# Patient Record
Sex: Male | Born: 1955 | Race: White | Hispanic: No | Marital: Married | Smoking: Former smoker
Health system: Southern US, Community
[De-identification: ages and names within clinical notes are randomized; demographics above are authoritative.]

## PROBLEM LIST (undated history)

## (undated) DIAGNOSIS — R0600 Dyspnea, unspecified: Secondary | ICD-10-CM

## (undated) DIAGNOSIS — J349 Unspecified disorder of nose and nasal sinuses: Secondary | ICD-10-CM

## (undated) DIAGNOSIS — J343 Hypertrophy of nasal turbinates: Secondary | ICD-10-CM

## (undated) DIAGNOSIS — F17200 Nicotine dependence, unspecified, uncomplicated: Secondary | ICD-10-CM

## (undated) DIAGNOSIS — D649 Anemia, unspecified: Secondary | ICD-10-CM

## (undated) DIAGNOSIS — R0602 Shortness of breath: Secondary | ICD-10-CM

## (undated) DIAGNOSIS — F419 Anxiety disorder, unspecified: Secondary | ICD-10-CM

## (undated) DIAGNOSIS — Z86010 Personal history of colonic polyps: Principal | ICD-10-CM

## (undated) HISTORY — PX: WISDOM TOOTH EXTRACTION: SHX21

## (undated) HISTORY — PX: HERNIA REPAIR: SHX51

## (undated) HISTORY — DX: Shortness of breath: R06.02

## (undated) HISTORY — PX: EYE SURGERY: SHX253

## (undated) HISTORY — PX: COLONOSCOPY: SHX174

## (undated) HISTORY — PX: CARPAL TUNNEL RELEASE: SHX101

## (undated) HISTORY — DX: Personal history of colonic polyps: Z86.010

## (undated) HISTORY — PX: TONSILLECTOMY: SUR1361

## (undated) HISTORY — PX: ULNAR NERVE REPAIR: SHX2594

---

## 2001-12-14 ENCOUNTER — Ambulatory Visit (HOSPITAL_BASED_OUTPATIENT_CLINIC_OR_DEPARTMENT_OTHER): Admission: RE | Admit: 2001-12-14 | Discharge: 2001-12-14 | Payer: Self-pay

## 2014-07-27 ENCOUNTER — Other Ambulatory Visit: Payer: Self-pay | Admitting: Cardiology

## 2014-07-27 ENCOUNTER — Ambulatory Visit
Admission: RE | Admit: 2014-07-27 | Discharge: 2014-07-27 | Disposition: A | Discharge status: Home / Self Care | Payer: 59 | Source: Ambulatory Visit | Attending: Cardiology | Admitting: Cardiology

## 2014-07-27 DIAGNOSIS — Z0000 Encounter for general adult medical examination without abnormal findings: Secondary | ICD-10-CM

## 2015-11-02 ENCOUNTER — Encounter: Payer: Self-pay | Admitting: Internal Medicine

## 2015-11-07 ENCOUNTER — Other Ambulatory Visit: Payer: Self-pay | Admitting: Otolaryngology

## 2015-11-07 DIAGNOSIS — R519 Headache, unspecified: Secondary | ICD-10-CM

## 2015-11-07 DIAGNOSIS — R51 Headache: Secondary | ICD-10-CM

## 2015-11-07 DIAGNOSIS — R0981 Nasal congestion: Secondary | ICD-10-CM

## 2015-11-09 ENCOUNTER — Ambulatory Visit
Admission: RE | Admit: 2015-11-09 | Discharge: 2015-11-09 | Disposition: A | Discharge status: Home / Self Care | Payer: BLUE CROSS/BLUE SHIELD | Source: Ambulatory Visit | Attending: Otolaryngology | Admitting: Otolaryngology

## 2015-11-09 DIAGNOSIS — R519 Headache, unspecified: Secondary | ICD-10-CM

## 2015-11-09 DIAGNOSIS — R0981 Nasal congestion: Secondary | ICD-10-CM

## 2015-11-09 DIAGNOSIS — R51 Headache: Secondary | ICD-10-CM

## 2015-11-29 ENCOUNTER — Encounter (HOSPITAL_BASED_OUTPATIENT_CLINIC_OR_DEPARTMENT_OTHER): Payer: Self-pay | Admitting: *Deleted

## 2015-12-01 ENCOUNTER — Ambulatory Visit: Payer: Self-pay | Admitting: Otolaryngology

## 2015-12-01 NOTE — H&P (Signed)
PREOPERATIVE H&P  Chief Complaint: nasal obstrruction  HPI: Austin Glass is a 60 y.o. male who presents for evaluation of a long history of nasal obstruction. On exam he has several large intranasal polys. CT scan showed rather extensive intranasal polyps left side worse than right. He has moderate ethmoid and mild maxillary disease. He has large intranasal turbinates. He's taken to the OR for FESS to remove nasal polyps and reduce the turbinates.  Past Medical History:  Diagnosis Date  . Anxiety   . Hypertrophy of nasal turbinates   . Sinus disease   . Smoker    Past Surgical History:  Procedure Laterality Date  . CARPAL TUNNEL RELEASE Left   . EYE SURGERY     cataract  . HERNIA REPAIR     UHR  . ULNAR NERVE REPAIR Left    decompression   Social History   Social History  . Marital status: Divorced    Spouse name: N/A  . Number of children: N/A  . Years of education: N/A   Social History Main Topics  . Smoking status: Current Every Day Smoker    Packs/day: 1.00  . Smokeless tobacco: Never Used  . Alcohol use Yes     Comment: social  . Drug use: No  . Sexual activity: Not Asked   Other Topics Concern  . None   Social History Narrative  . None   History reviewed. No pertinent family history. Allergies  Allergen Reactions  . Penicillins Rash   Prior to Admission medications   Medication Sig Start Date End Date Taking? Authorizing Provider  fluticasone (FLONASE) 50 MCG/ACT nasal spray Place into both nostrils daily.   Yes Historical Provider, MD  traZODone (DESYREL) 150 MG tablet Take by mouth at bedtime.   Yes Historical Provider, MD     Positive ROS: nasal obstruction  All other systems have been reviewed and were otherwise negative with the exception of those mentioned in the HPI and as above.  Physical Exam: There were no vitals filed for this visit.  General: Alert, no acute distress Oral: Normal oral mucosa and tonsils Nasal: Bilateral  intranasal polyps, Large turbinates. Mild septal deformity Neck: No palpable adenopathy or thyroid nodules Ear: Ear canal is clear with normal appearing TMs Cardiovascular: Regular rate and rhythm, no murmur.  Respiratory: Clear to auscultation Neurologic: Alert and oriented x 3   Assessment/Plan: [place] for Procedure(s): BILATERAL TOTAL  ETHMOIDECTOMY, MAXILLARY OSTIA ENLARGEMENT BILATERAL TURBINATE REDUCTION FUCTIONAL  ENDOSCOPIC SINUS SURGERY WITH FUSION   Melony Overly, MD 12/01/2015 9:47 AM

## 2015-12-05 ENCOUNTER — Ambulatory Visit (HOSPITAL_BASED_OUTPATIENT_CLINIC_OR_DEPARTMENT_OTHER): Payer: BLUE CROSS/BLUE SHIELD | Admitting: Anesthesiology

## 2015-12-05 ENCOUNTER — Ambulatory Visit (HOSPITAL_BASED_OUTPATIENT_CLINIC_OR_DEPARTMENT_OTHER)
Admission: RE | Admit: 2015-12-05 | Discharge: 2015-12-05 | Disposition: A | Discharge status: Home / Self Care | Payer: BLUE CROSS/BLUE SHIELD | Source: Ambulatory Visit | Attending: Otolaryngology | Admitting: Otolaryngology

## 2015-12-05 ENCOUNTER — Encounter (HOSPITAL_BASED_OUTPATIENT_CLINIC_OR_DEPARTMENT_OTHER): Payer: Self-pay | Admitting: *Deleted

## 2015-12-05 ENCOUNTER — Encounter (HOSPITAL_BASED_OUTPATIENT_CLINIC_OR_DEPARTMENT_OTHER)
Admission: RE | Disposition: A | Discharge status: Home / Self Care | Payer: Self-pay | Source: Ambulatory Visit | Attending: Otolaryngology

## 2015-12-05 DIAGNOSIS — F419 Anxiety disorder, unspecified: Secondary | ICD-10-CM | POA: Diagnosis not present

## 2015-12-05 DIAGNOSIS — J3489 Other specified disorders of nose and nasal sinuses: Secondary | ICD-10-CM | POA: Diagnosis not present

## 2015-12-05 DIAGNOSIS — J322 Chronic ethmoidal sinusitis: Secondary | ICD-10-CM | POA: Diagnosis not present

## 2015-12-05 DIAGNOSIS — J320 Chronic maxillary sinusitis: Secondary | ICD-10-CM | POA: Diagnosis not present

## 2015-12-05 DIAGNOSIS — J338 Other polyp of sinus: Secondary | ICD-10-CM | POA: Diagnosis not present

## 2015-12-05 DIAGNOSIS — J343 Hypertrophy of nasal turbinates: Secondary | ICD-10-CM | POA: Insufficient documentation

## 2015-12-05 DIAGNOSIS — F17210 Nicotine dependence, cigarettes, uncomplicated: Secondary | ICD-10-CM | POA: Diagnosis not present

## 2015-12-05 DIAGNOSIS — Z880 Allergy status to penicillin: Secondary | ICD-10-CM | POA: Diagnosis not present

## 2015-12-05 HISTORY — DX: Anxiety disorder, unspecified: F41.9

## 2015-12-05 HISTORY — PX: TURBINATE REDUCTION: SHX6157

## 2015-12-05 HISTORY — PX: SINUS ENDO WITH FUSION: SHX5329

## 2015-12-05 HISTORY — DX: Hypertrophy of nasal turbinates: J34.3

## 2015-12-05 HISTORY — DX: Nicotine dependence, unspecified, uncomplicated: F17.200

## 2015-12-05 HISTORY — DX: Unspecified disorder of nose and nasal sinuses: J34.9

## 2015-12-05 HISTORY — PX: ETHMOIDECTOMY: SHX5197

## 2015-12-05 SURGERY — ETHMOIDECTOMY
Anesthesia: General | Site: Nose | Laterality: Bilateral

## 2015-12-05 MED ORDER — PROPOFOL 500 MG/50ML IV EMUL
INTRAVENOUS | Status: AC
  Filled 2015-12-05: qty 50

## 2015-12-05 MED ORDER — ONDANSETRON HCL 4 MG/2ML IJ SOLN
INTRAMUSCULAR | Status: DC | PRN
  Administered 2015-12-05: 4 mg via INTRAVENOUS

## 2015-12-05 MED ORDER — DEXAMETHASONE SODIUM PHOSPHATE 4 MG/ML IJ SOLN
INTRAMUSCULAR | Status: DC | PRN
  Administered 2015-12-05: 10 mg via INTRAVENOUS

## 2015-12-05 MED ORDER — METHYLPREDNISOLONE ACETATE 80 MG/ML IJ SUSP
INTRAMUSCULAR | Status: DC | PRN
  Administered 2015-12-05: 80 mg

## 2015-12-05 MED ORDER — EPHEDRINE 5 MG/ML INJ
INTRAVENOUS | Status: AC
  Filled 2015-12-05: qty 10

## 2015-12-05 MED ORDER — MUPIROCIN 2 % EX OINT
TOPICAL_OINTMENT | CUTANEOUS | Status: DC | PRN
  Administered 2015-12-05: 1 via TOPICAL

## 2015-12-05 MED ORDER — CEFAZOLIN SODIUM-DEXTROSE 2-4 GM/100ML-% IV SOLN
INTRAVENOUS | Status: AC
  Filled 2015-12-05: qty 100

## 2015-12-05 MED ORDER — SUGAMMADEX SODIUM 200 MG/2ML IV SOLN
INTRAVENOUS | Status: DC | PRN
  Administered 2015-12-05: 200 mg via INTRAVENOUS

## 2015-12-05 MED ORDER — PHENYLEPHRINE 40 MCG/ML (10ML) SYRINGE FOR IV PUSH (FOR BLOOD PRESSURE SUPPORT)
PREFILLED_SYRINGE | INTRAVENOUS | Status: AC
  Filled 2015-12-05: qty 10

## 2015-12-05 MED ORDER — MIDAZOLAM HCL 2 MG/2ML IJ SOLN
1.0000 mg | INTRAMUSCULAR | Status: DC | PRN
  Administered 2015-12-05: 2 mg via INTRAVENOUS

## 2015-12-05 MED ORDER — PROMETHAZINE HCL 25 MG/ML IJ SOLN: 6.2500 mg | INTRAMUSCULAR | Status: DC | PRN

## 2015-12-05 MED ORDER — DEXAMETHASONE SODIUM PHOSPHATE 10 MG/ML IJ SOLN
INTRAMUSCULAR | Status: AC
  Filled 2015-12-05: qty 1

## 2015-12-05 MED ORDER — SUCCINYLCHOLINE CHLORIDE 200 MG/10ML IV SOSY
PREFILLED_SYRINGE | INTRAVENOUS | Status: AC
  Filled 2015-12-05: qty 10

## 2015-12-05 MED ORDER — MUPIROCIN 2 % EX OINT
TOPICAL_OINTMENT | CUTANEOUS | Status: AC
  Filled 2015-12-05: qty 22

## 2015-12-05 MED ORDER — OXYMETAZOLINE HCL 0.05 % NA SOLN
NASAL | Status: DC | PRN
  Administered 2015-12-05: 1 via TOPICAL

## 2015-12-05 MED ORDER — CEFAZOLIN SODIUM-DEXTROSE 2-4 GM/100ML-% IV SOLN
2.0000 g | INTRAVENOUS | Status: AC
  Administered 2015-12-05: 2 g via INTRAVENOUS

## 2015-12-05 MED ORDER — HYDROCODONE-ACETAMINOPHEN 5-325 MG PO TABS: 1.0000 | ORAL_TABLET | Freq: Four times a day (QID) | ORAL | 0 refills | Status: DC | PRN

## 2015-12-05 MED ORDER — ROCURONIUM BROMIDE 100 MG/10ML IV SOLN
INTRAVENOUS | Status: DC | PRN
  Administered 2015-12-05: 50 mg via INTRAVENOUS
  Administered 2015-12-05: 10 mg via INTRAVENOUS

## 2015-12-05 MED ORDER — LIDOCAINE 2% (20 MG/ML) 5 ML SYRINGE
INTRAMUSCULAR | Status: AC
  Filled 2015-12-05: qty 5

## 2015-12-05 MED ORDER — LIDOCAINE-EPINEPHRINE 1 %-1:100000 IJ SOLN
INTRAMUSCULAR | Status: AC
  Filled 2015-12-05: qty 1

## 2015-12-05 MED ORDER — ATROPINE SULFATE 0.4 MG/ML IV SOSY
PREFILLED_SYRINGE | INTRAVENOUS | Status: AC
  Filled 2015-12-05: qty 2.5

## 2015-12-05 MED ORDER — METHYLPREDNISOLONE ACETATE 80 MG/ML IJ SUSP
INTRAMUSCULAR | Status: AC
  Filled 2015-12-05: qty 1

## 2015-12-05 MED ORDER — SCOPOLAMINE 1 MG/3DAYS TD PT72: 1.0000 | MEDICATED_PATCH | Freq: Once | TRANSDERMAL | Status: DC | PRN

## 2015-12-05 MED ORDER — ONDANSETRON HCL 4 MG/2ML IJ SOLN
INTRAMUSCULAR | Status: AC
  Filled 2015-12-05: qty 2

## 2015-12-05 MED ORDER — PROPOFOL 10 MG/ML IV BOLUS
INTRAVENOUS | Status: DC | PRN
Start: 2015-12-05 — End: 2015-12-05
  Administered 2015-12-05: 200 mg via INTRAVENOUS

## 2015-12-05 MED ORDER — FENTANYL CITRATE (PF) 100 MCG/2ML IJ SOLN
INTRAMUSCULAR | Status: AC
  Filled 2015-12-05: qty 2

## 2015-12-05 MED ORDER — LIDOCAINE-EPINEPHRINE 1 %-1:100000 IJ SOLN
INTRAMUSCULAR | Status: DC | PRN
  Administered 2015-12-05: 10 mL

## 2015-12-05 MED ORDER — LACTATED RINGERS IV SOLN
INTRAVENOUS | Status: DC
Start: 2015-12-05 — End: 2015-12-05
  Administered 2015-12-05 (×2): via INTRAVENOUS

## 2015-12-05 MED ORDER — HYDROMORPHONE HCL 1 MG/ML IJ SOLN: 0.2500 mg | INTRAMUSCULAR | Status: DC | PRN

## 2015-12-05 MED ORDER — FENTANYL CITRATE (PF) 100 MCG/2ML IJ SOLN
50.0000 ug | INTRAMUSCULAR | Status: AC | PRN
  Administered 2015-12-05: 50 ug via INTRAVENOUS
  Administered 2015-12-05: 75 ug via INTRAVENOUS
  Administered 2015-12-05: 50 ug via INTRAVENOUS
  Administered 2015-12-05: 25 ug via INTRAVENOUS

## 2015-12-05 MED ORDER — BACITRACIN ZINC 500 UNIT/GM EX OINT
TOPICAL_OINTMENT | CUTANEOUS | Status: AC
Start: 2015-12-05 — End: 2015-12-05
  Filled 2015-12-05: qty 28.35

## 2015-12-05 MED ORDER — CEPHALEXIN 500 MG PO CAPS: 500.0000 mg | ORAL_CAPSULE | Freq: Two times a day (BID) | ORAL | 0 refills | Status: AC

## 2015-12-05 MED ORDER — EPHEDRINE SULFATE 50 MG/ML IJ SOLN
INTRAMUSCULAR | Status: DC | PRN
  Administered 2015-12-05 (×2): 10 mg via INTRAVENOUS

## 2015-12-05 MED ORDER — MIDAZOLAM HCL 2 MG/2ML IJ SOLN
INTRAMUSCULAR | Status: AC
  Filled 2015-12-05: qty 2

## 2015-12-05 MED ORDER — SODIUM CHLORIDE 0.9 % IV SOLN
INTRAVENOUS | Status: DC | PRN
  Administered 2015-12-05: 400 mL via INTRAMUSCULAR

## 2015-12-05 MED ORDER — OXYMETAZOLINE HCL 0.05 % NA SOLN
NASAL | Status: AC
  Filled 2015-12-05: qty 15

## 2015-12-05 SURGICAL SUPPLY — 73 items
ATTRACTOMAT 16X20 MAGNETIC DRP (DRAPES) IMPLANT
BALLN SINUPLASTY KIT 6X16 (BALLOONS)
BALLOON SINUPLASTY KIT 6X16 (BALLOONS) IMPLANT
BLADE INF TURB ROT M4 2 5PK (BLADE) ×3 IMPLANT
BLADE INF TURB ROT M4 2MM 5PK (BLADE) ×1
BLADE RAD60 ROTATE M4 4 5PK (BLADE) IMPLANT
BLADE RAD60 ROTATE M4 4MM 5PK (BLADE)
BLADE ROTATE RAD 12 4 M4 (BLADE) IMPLANT
BLADE ROTATE RAD 12 4MM M4 (BLADE)
BLADE ROTATE RAD 40 4 M4 (BLADE) IMPLANT
BLADE ROTATE RAD 40 4MM M4 (BLADE)
BLADE ROTATE TRICUT 4MX13CM M4 (BLADE) ×1
BLADE ROTATE TRICUT 4X13 M4 (BLADE) ×3 IMPLANT
BUR HS RAD FRONTAL 3 (BURR) IMPLANT
BUR HS RAD FRONTAL 3MM (BURR)
CANISTER SUC SOCK COL 7IN (MISCELLANEOUS) IMPLANT
CANISTER SUCT 1200ML W/VALVE (MISCELLANEOUS) ×8 IMPLANT
CLEANER CAUTERY TIP 5X5 PAD (MISCELLANEOUS) IMPLANT
COAGULATOR SUCT 8FR VV (MISCELLANEOUS) ×4 IMPLANT
COVER PROBE W GEL 5X96 (DRAPES) IMPLANT
DECANTER SPIKE VIAL GLASS SM (MISCELLANEOUS) ×4 IMPLANT
DEVICE INFLATION SEID (MISCELLANEOUS) IMPLANT
DRAPE SURG 17X23 STRL (DRAPES) IMPLANT
DRESSING NASAL KENNEDY 3.5X.9 (MISCELLANEOUS) IMPLANT
DRSG NASAL KENNEDY 3.5X.9 (MISCELLANEOUS)
DRSG NASAL KENNEDY LMNT 8CM (GAUZE/BANDAGES/DRESSINGS) IMPLANT
DRSG NASOPORE 8CM (GAUZE/BANDAGES/DRESSINGS) ×4 IMPLANT
DRSG TELFA 3X8 NADH (GAUZE/BANDAGES/DRESSINGS) IMPLANT
ELECT COATED BLADE 2.86 ST (ELECTRODE) IMPLANT
ELECT NEEDLE BLADE 2-5/6 (NEEDLE) IMPLANT
ELECT REM PT RETURN 9FT ADLT (ELECTROSURGICAL) ×4
ELECTRODE REM PT RTRN 9FT ADLT (ELECTROSURGICAL) ×2 IMPLANT
GLOVE EXAM NITRILE MD LF STRL (GLOVE) ×4 IMPLANT
GLOVE SS BIOGEL STRL SZ 7.5 (GLOVE) ×2 IMPLANT
GLOVE SUPERSENSE BIOGEL SZ 7.5 (GLOVE) ×2
GLOVE SURG SS PI 7.0 STRL IVOR (GLOVE) ×4 IMPLANT
GOWN STRL REUS W/ TWL LRG LVL3 (GOWN DISPOSABLE) ×2 IMPLANT
GOWN STRL REUS W/ TWL XL LVL3 (GOWN DISPOSABLE) ×2 IMPLANT
GOWN STRL REUS W/TWL LRG LVL3 (GOWN DISPOSABLE) ×2
GOWN STRL REUS W/TWL XL LVL3 (GOWN DISPOSABLE) ×2
HEMOSTAT SURGICEL .5X2 ABSORB (HEMOSTASIS) IMPLANT
HEMOSTAT SURGICEL 2X14 (HEMOSTASIS) IMPLANT
IV NS 1000ML (IV SOLUTION)
IV NS 1000ML BAXH (IV SOLUTION) IMPLANT
IV NS 500ML (IV SOLUTION) ×2
IV NS 500ML BAXH (IV SOLUTION) ×2 IMPLANT
NEEDLE PRECISIONGLIDE 27X1.5 (NEEDLE) ×4 IMPLANT
NEEDLE SPNL 25GX3.5 QUINCKE BL (NEEDLE) IMPLANT
NS IRRIG 1000ML POUR BTL (IV SOLUTION) ×4 IMPLANT
PACK BASIN DAY SURGERY FS (CUSTOM PROCEDURE TRAY) ×4 IMPLANT
PACK ENT DAY SURGERY (CUSTOM PROCEDURE TRAY) ×4 IMPLANT
PAD CLEANER CAUTERY TIP 5X5 (MISCELLANEOUS)
PATTIES SURGICAL .5 X3 (DISPOSABLE) ×4 IMPLANT
PENCIL BUTTON HOLSTER BLD 10FT (ELECTRODE) IMPLANT
SHEET SILASTIC 8X6X.030 25-30 (MISCELLANEOUS) IMPLANT
SLEEVE SCD COMPRESS KNEE MED (MISCELLANEOUS) ×4 IMPLANT
SOLUTION ANTI FOG 6CC (MISCELLANEOUS) ×4 IMPLANT
SPONGE GAUZE 2X2 8PLY STER LF (GAUZE/BANDAGES/DRESSINGS) ×2
SPONGE GAUZE 2X2 8PLY STRL LF (GAUZE/BANDAGES/DRESSINGS) ×6 IMPLANT
SUT CHROMIC 4 0 PS 2 18 (SUTURE) IMPLANT
SUT ETHILON 3 0 PS 1 (SUTURE) IMPLANT
SUT SILK 2 0 FS (SUTURE) IMPLANT
SUT VIC AB 4-0 P-3 18XBRD (SUTURE) IMPLANT
SUT VIC AB 4-0 P3 18 (SUTURE)
SYR 3ML 18GX1 1/2 (SYRINGE) IMPLANT
TOWEL OR 17X24 6PK STRL BLUE (TOWEL DISPOSABLE) ×8 IMPLANT
TRACKER ENT INSTRUMENT (MISCELLANEOUS) ×4 IMPLANT
TRACKER ENT PATIENT (MISCELLANEOUS) ×4 IMPLANT
TRAY DSU PREP LF (CUSTOM PROCEDURE TRAY) ×4 IMPLANT
TUBE CONNECTING 20'X1/4 (TUBING) ×1
TUBE CONNECTING 20X1/4 (TUBING) ×3 IMPLANT
TUBING STRAIGHTSHOT EPS 5PK (TUBING) ×4 IMPLANT
YANKAUER SUCT BULB TIP NO VENT (SUCTIONS) ×4 IMPLANT

## 2015-12-05 NOTE — Brief Op Note (Signed)
12/05/2015  9:31 AM  PATIENT:  Austin Glass  60 y.o. male  PRE-OPERATIVE DIAGNOSIS:  SINUS DISEASE, NASAL POLYPS, TURBINATE HYPERTROPHY  POST-OPERATIVE DIAGNOSIS:  SINUS DISEASE, NASAL POLYPS, TURBINATE HYPERTROPHY  PROCEDURE:  Procedure(s): BILATERAL TOTAL  ETHMOIDECTOMY, MAXILLARY OSTEA  ENLARGEMENT (Bilateral) BILATERAL TURBINATE REDUCTION (Bilateral) FUCTIONAL  ENDOSCOPIC SINUS SURGERY WITH FUSION NAVIGATION (Bilateral)  SURGEON:  Surgeon(s) and Role:    * Rozetta Nunnery, MD - Primary  PHYSICIAN ASSISTANT:   ASSISTANTS: none   ANESTHESIA:   general  EBL:  Total I/O In: 1000 [I.V.:1000] Out: 25 [Blood:25]  BLOOD ADMINISTERED:none  DRAINS: none   LOCAL MEDICATIONS USED:  LIDOCAINE with Epi 10 cc  SPECIMEN:  Source of Specimen:  nasal polyps  DISPOSITION OF SPECIMEN:  PATHOLOGY  COUNTS:  YES  TOURNIQUET:  * No tourniquets in log *  DICTATION: .Other Dictation: Dictation Number [id number]  PLAN OF CARE: Discharge to home after PACU  PATIENT DISPOSITION:  PACU - hemodynamically stable.   Delay start of Pharmacological VTE agent (>24hrs) due to surgical blood loss or risk of bleeding: yes

## 2015-12-05 NOTE — Anesthesia Preprocedure Evaluation (Signed)
Anesthesia Evaluation  Patient identified by MRN, date of birth, ID band Patient awake    Reviewed: Allergy & Precautions, NPO status , Patient's Chart, lab work & pertinent test results  Airway Mallampati: II  TM Distance: >3 FB Neck ROM: Full    Dental  (+) Teeth Intact   Pulmonary COPD, Current Smoker,   Smokers cough  + rhonchi        Cardiovascular negative cardio ROS   Rhythm:Regular Rate:Normal     Neuro/Psych negative neurological ROS     GI/Hepatic negative GI ROS, Neg liver ROS,   Endo/Other  negative endocrine ROS  Renal/GU negative Renal ROS     Musculoskeletal   Abdominal   Peds  Hematology negative hematology ROS (+)   Anesthesia Other Findings   Reproductive/Obstetrics                             Anesthesia Physical Anesthesia Plan  ASA: II  Anesthesia Plan: General   Post-op Pain Management:    Induction: Intravenous  Airway Management Planned: Oral ETT  Additional Equipment:   Intra-op Plan:   Post-operative Plan: Extubation in OR  Informed Consent: I have reviewed the patients History and Physical, chart, labs and discussed the procedure including the risks, benefits and alternatives for the proposed anesthesia with the patient or authorized representative who has indicated his/her understanding and acceptance.     Plan Discussed with: CRNA  Anesthesia Plan Comments:         Anesthesia Quick Evaluation

## 2015-12-05 NOTE — Anesthesia Procedure Notes (Signed)
Procedure Name: Intubation Date/Time: 12/05/2015 7:40 AM Performed by: Melynda Ripple D Pre-anesthesia Checklist: Patient identified, Emergency Drugs available, Suction available and Patient being monitored Patient Re-evaluated:Patient Re-evaluated prior to inductionOxygen Delivery Method: Circle system utilized Preoxygenation: Pre-oxygenation with 100% oxygen Intubation Type: IV induction Ventilation: Mask ventilation without difficulty Laryngoscope Size: Mac and 3 Grade View: Grade I Tube type: Oral Tube size: 7.0 mm Number of attempts: 1 Airway Equipment and Method: Stylet and Oral airway Placement Confirmation: ETT inserted through vocal cords under direct vision,  positive ETCO2 and breath sounds checked- equal and bilateral Secured at: 24 cm Tube secured with: Tape Dental Injury: Teeth and Oropharynx as per pre-operative assessment

## 2015-12-05 NOTE — Interval H&P Note (Signed)
History and Physical Interval Note:  12/05/2015 7:27 AM  Austin Glass  has presented today for surgery, with the diagnosis of SINUS DISEASE,TURBINATE HYPERTROPHY  The various methods of treatment have been discussed with the patient and family. After consideration of risks, benefits and other options for treatment, the patient has consented to  Procedure(s): BILATERAL TOTAL  ETHMOIDECTOMY, MAXILLARY OSTIA ENLARGEMENT (Bilateral) BILATERAL TURBINATE REDUCTION (Bilateral) FUCTIONAL  ENDOSCOPIC SINUS SURGERY WITH FUSION (N/A) as a surgical intervention .  The patient's history has been reviewed, patient examined, no change in status, stable for surgery.  I have reviewed the patient's chart and labs.  Questions were answered to the patient's satisfaction.     Taija Mathias

## 2015-12-05 NOTE — Progress Notes (Signed)
Pt on admission to PACU, talking. He has asked several times if he was alive. Explained yes and then he said he would be better off not being here. Pt constant mumbling.

## 2015-12-05 NOTE — Transfer of Care (Signed)
Immediate Anesthesia Transfer of Care Note  Patient: Austin Glass  Procedure(s) Performed: Procedure(s): BILATERAL TOTAL  ETHMOIDECTOMY, MAXILLARY OSTEA  ENLARGEMENT (Bilateral) BILATERAL TURBINATE REDUCTION (Bilateral) FUCTIONAL  ENDOSCOPIC SINUS SURGERY WITH FUSION NAVIGATION (Bilateral)  Patient Location: PACU  Anesthesia Type:General  Level of Consciousness: awake  Airway & Oxygen Therapy: Patient Spontanous Breathing and Patient connected to face mask oxygen  Post-op Assessment: Report given to RN and Post -op Vital signs reviewed and stable  Post vital signs: Reviewed and stable  Last Vitals:  Vitals:   12/05/15 0618  BP: 126/77  Pulse: 67  Resp: 18  Temp: 36.6 C    Last Pain:  Vitals:   12/05/15 0618  TempSrc: Oral         Complications: No apparent anesthesia complications

## 2015-12-05 NOTE — Anesthesia Postprocedure Evaluation (Signed)
Anesthesia Post Note  Patient: Austin Glass  Procedure(s) Performed: Procedure(s) (LRB): BILATERAL TOTAL  ETHMOIDECTOMY, MAXILLARY OSTEA  ENLARGEMENT (Bilateral) BILATERAL TURBINATE REDUCTION (Bilateral) FUCTIONAL  ENDOSCOPIC SINUS SURGERY WITH FUSION NAVIGATION (Bilateral)  Patient location during evaluation: PACU Anesthesia Type: General Level of consciousness: awake and alert Pain management: pain level controlled Vital Signs Assessment: post-procedure vital signs reviewed and stable Respiratory status: spontaneous breathing, nonlabored ventilation, respiratory function stable and patient connected to nasal cannula oxygen Cardiovascular status: blood pressure returned to baseline and stable Postop Assessment: no signs of nausea or vomiting Anesthetic complications: no    Last Vitals:  Vitals:   12/05/15 1030 12/05/15 1059  BP: 130/81 (!) 150/76  Pulse: 81 82  Resp: 14 18  Temp:  36.6 C    Last Pain:  Vitals:   12/05/15 1059  TempSrc: Oral  PainSc: 0-No pain                 Eunice Winecoff,Darrick TERRILL

## 2015-12-05 NOTE — Discharge Instructions (Addendum)
Tylenol , motrin or hydrocodone prn pain Apply cool compress to nose if you have much bleeding  Irrigate nose with saline nasal spray 2-3 times daily Return to see Dr Lucia Gaskins at his office in 3 days   Friday at 4:15 Call office if you have any problems or questions    [phone]       Post Anesthesia Home Care Instructions  Activity: Get plenty of rest for the remainder of the day. A responsible adult should stay with you for 24 hours following the procedure.  For the next 24 hours, DO NOT: -Drive a car -Paediatric nurse -Drink alcoholic beverages -Take any medication unless instructed by your physician -Make any legal decisions or sign important papers.  Meals: Start with liquid foods such as gelatin or soup. Progress to regular foods as tolerated. Avoid greasy, spicy, heavy foods. If nausea and/or vomiting occur, drink only clear liquids until the nausea and/or vomiting subsides. Call your physician if vomiting continues.  Special Instructions/Symptoms: Your throat may feel dry or sore from the anesthesia or the breathing tube placed in your throat during surgery. If this causes discomfort, gargle with warm salt water. The discomfort should disappear within 24 hours.  If you had a scopolamine patch placed behind your ear for the management of post- operative nausea and/or vomiting:  1. The medication in the patch is effective for 72 hours, after which it should be removed.  Wrap patch in a tissue and discard in the trash. Wash hands thoroughly with soap and water. 2. You may remove the patch earlier than 72 hours if you experience unpleasant side effects which may include dry mouth, dizziness or visual disturbances. 3. Avoid touching the patch. Wash your hands with soap and water after contact with the patch.

## 2015-12-06 ENCOUNTER — Encounter (HOSPITAL_BASED_OUTPATIENT_CLINIC_OR_DEPARTMENT_OTHER): Payer: Self-pay | Admitting: Otolaryngology

## 2015-12-06 NOTE — Op Note (Signed)
NAME:  AYCE, BOMBARA                     ACCOUNT NO.:  MEDICAL RECORD NO.:  [id number]  LOCATION:                                 FACILITY:  PHYSICIAN:  [name]. Lucia Gaskins, M.D. DATE OF BIRTH:  DATE OF PROCEDURE:  12/05/2015 DATE OF DISCHARGE:                              OPERATIVE REPORT   PREOPERATIVE DIAGNOSES:  Bilateral sinonasal polyps with chronic ethmoid, maxillary disease and bilateral inferior turbinate hypertrophy with nasal obstruction.  POSTOPERATIVE DIAGNOSES:  Bilateral sinonasal polyps with chronic ethmoid, maxillary disease and bilateral inferior turbinate hypertrophy with nasal obstruction.  OPERATION PERFORMED:  Functional endoscopic sinus surgery with bilateral total ethmoidectomy and bilateral maxillary ostial enlargement with removal of sinonasal polyps.  Bilateral inferior turbinate reductions with Medtronic turbinate blade.  Use of Fusion.  SURGEON:  [name]. Lucia Gaskins, M.D.  ANESTHESIA:  General endotracheal.  COMPLICATIONS:  None.  BRIEF CLINICAL NOTE:  Austin Glass is a 60 year old gentleman who has had longstanding nasal obstruction and on exam, has bilateral sinonasal polyps.  He had couple of larger polyps removed from the right nasal passageway earlier in the office and had improved breathing, but still has significant polyps in the left nasal airway as well as a CT scan demonstrated bilateral ethmoid, nasal frontal and some maxillary disease on both sides.  He still has nasal obstruction, although, he is breathing better since removing the polyps in the nose in the office. He was taken to the operating room at this time for functional endoscopic sinus surgery with removal of sinonasal polyps and turbinate reductions.  DESCRIPTION OF PROCEDURE:  After adequate endotracheal anesthesia, the patient received 2 g of Ancef and 10 mg of Decadron IV preoperatively. Nose was prepped with Betadine solution.  The Fusion apparatus  was calibrated.  First, several large polyps were removed from the left nasal cavity and sent to Pathology.  Next, the right side was approached.  Uncinate process was incised with sickle knife and removed the maxillary ostia on the right side was identified and was enlarged with straight through cup and backbiting forceps.  Next, using the microdebrider, the anterior and posterior ethmoid cells were opened up and polypoid disease was removed.  This was extended up to the nasal frontal area.  Following this, the left eye was approached.  Again, the uncinate process was removed with a sickle knife and the maxillary ostia on left side were identified and enlarged with straight through cup and backcutting forceps.  Using the microdebrider, again the anterior and posterior ethmoid cells were opened up anteriorly and posteriorly and polypoid disease was removed.  The Fusion apparatus was used to identify the roof of the ethmoid and the lamina.  On the left side, nasal frontal polyps were removed with a microdebrider.  There was a polyp extruding from the posterior medial aspect of the left middle turbinate, which was removed.  After opening up the anterior and posterior ethmoid cells and removing all the visual polyps, the procedure was completed.  Hemostasis was obtained with suction cautery.  The nasal cavity and turbinates were injected with 80 mg of Depo-Medrol and NasoPore soaked in Bactroban 2% ointment was placed within  the middle meatus and ethmoid areas bilaterally.  Medtronic turbinate blade was used to reduce the inferior turbinates and the turbinates were outfractured.  Hemostasis was obtained with cautery.  This completed the procedure.  Chrostopher was awoken from anesthesia and transferred to the recovery room, postop doing well.  DISPOSITION:  He was discharged home later this morning on Keflex 500 mg b.i.d. for 1 week, Tylenol, Motrin and/or hydrocodone p.r.n. pain.  I will have  him follow up in my office in 3 days for recheck.          ______________________________ [name]. Lucia Gaskins, M.D.     CEN/MEDQ  D:  12/05/2015  T:  12/06/2015  Job:  [id number]  cc:   Claris Gower, M.D.

## 2015-12-06 NOTE — Op Note (Unsigned)
Austin Glass, Austin Glass                ACCOUNT NO.:  [id number]  MEDICAL RECORD NO.:  [id number]  LOCATION:                                 FACILITY:  PHYSICIAN:  [place] Austin Glass, M.D.DATE OF BIRTH:  01-24-1956  DATE OF PROCEDURE:  12/05/2015 DATE OF DISCHARGE:                              OPERATIVE REPORT   PREOPERATIVE DIAGNOSES:  Bilateral sinonasal polyps with turbinate hypertrophy and ethmoid, maxillary and frontal disease.  POSTOPERATIVE DIAGNOSES:  Bilateral sinonasal polyps with turbinate hypertrophy and ethmoid, maxillary and frontal disease.  OPERATION PERFORMED:  Functional endoscopic sinus surgery with bilateral total ethmoidectomy and bilateral maxillary ostial enlargement with removal of sinonasal polyps.  Bilateral inferior turbinate reductions with Medtronic turbinate blade.  SURGEON:  Austin Glass. Austin Glass, M.D.  ANESTHESIA:  General endotracheal.  COMPLICATIONS:  None.  BRIEF CLINICAL NOTE:  Austin Glass is a 60 year old gentleman who has had chronic history of nasal obstruction and on exam, has bilateral sinonasal polyps.  He had the larger polyps removed from the right nasal cavity in the office, but still has nasal obstruction, worse on the left side.  Recent CT scan shows bilateral ethmoid minimal maxillary disease and partial obstruction of the left nasal frontal area.  On exam, he has minimal septal deformity.  He has large turbinates and swollen mucous membranes and several polyps.  He was taken to the operating room at this time for functional endoscopic sinus surgery with removal of sinonasal polyps and bilateral inferior turbinate reductions.  DESCRIPTION OF PROCEDURE:  After adequate endotracheal anesthesia, the patient received 10 mg of Decadron and 2 g of Ancef IV preoperatively. Nose was prepped with Betadine solution and draped out with sterile towels.  The Fusion apparatus was used during the procedure and was calibrated.  On exam, he  has several large polyps in the left nasal passageway, which were removed with forceps and sent to Pathology. Next, the right side was approached.  The uncinate process was incised with sickle knife.  Maxillary ostia were enlarged with straight through cup forceps and backbiting cup forceps.  The microdebrider was used to remove the several polyps from the right anterior and posterior ethmoid area as well as the right nasal frontal region.  This completed the right side.  Next, the left side was approached.  Again, uncinate process was incised and removed.  The maxillary ostia on the left side were identified and enlarged with straight through cup and backcutting forceps.  Using the microdebrider, polyps were removed from the ethmoid area, and anterior and posterior ethmoid cells were opened up.  In addition, on the left side, he had a polyp posteriorly on the medial aspect of the middle turbinate, which was removed.  Microdebrider was used to open up the nasal frontal area on the left side.  Fusion was then used identify the roof of the ethmoid area.  The lamina as well as the roof were preserved throughout the procedure using a Fusion.  This completed the left side.  Next, using the Medtronic turbinate blade, submucosal turbinate reductions were performed bilaterally and the turbinates were outfractured.  Hemostasis was obtained with suction cautery.  After obtaining adequate hemostasis, the nasal  cavity was injected with 80 mg of Depo-Medrol.  NasoPore soaked in Bactroban 2% was placed in the middle meatus and the ethmoid areas on both sides.  This completed the procedure.  Austin Glass was awoken from anesthesia and transferred to the recovery room, postop doing well.  DISPOSITION:  Austin Glass was discharged home later this morning on Keflex 500 mg b.i.d. for [redacted] week along with saline irrigations, Tylenol, Motrin and/or hydrocodone p.r.n. pain.  We will have him follow up in my office in 3 days  for recheck.    ______________________________ Austin Glass. Austin Glass, M.D.   ______________________________ Austin Glass. Austin Glass, M.D.    CEN/MEDQ  D:  12/05/2015  T:  12/06/2015  Job:  [id number]  cc:   Austin Glass, M.D.

## 2015-12-14 ENCOUNTER — Ambulatory Visit (AMBULATORY_SURGERY_CENTER): Payer: BLUE CROSS/BLUE SHIELD

## 2015-12-14 VITALS — Ht 67.0 in | Wt 189.8 lb

## 2015-12-14 DIAGNOSIS — Z1211 Encounter for screening for malignant neoplasm of colon: Secondary | ICD-10-CM

## 2015-12-14 MED ORDER — BISACODYL 5 MG PO TBEC: 5.0000 mg | DELAYED_RELEASE_TABLET | Freq: Once | ORAL | 0 refills | Status: AC

## 2015-12-14 MED ORDER — POLYETHYLENE GLYCOL 3350 17 GM/SCOOP PO POWD: 1.0000 | Freq: Every day | ORAL | 3 refills | Status: DC

## 2015-12-14 NOTE — Progress Notes (Signed)
No allergies to eggs or soy No past problems with anesthesia No diet meds No home oxygen  Registered for emmi 

## 2015-12-15 ENCOUNTER — Encounter: Payer: Self-pay | Admitting: Internal Medicine

## 2015-12-28 ENCOUNTER — Ambulatory Visit (AMBULATORY_SURGERY_CENTER): Payer: BLUE CROSS/BLUE SHIELD | Admitting: Internal Medicine

## 2015-12-28 ENCOUNTER — Encounter: Payer: Self-pay | Admitting: Internal Medicine

## 2015-12-28 VITALS — BP 106/75 | HR 92 | Temp 98.4°F | Resp 18 | Ht 67.0 in | Wt 198.0 lb

## 2015-12-28 DIAGNOSIS — D123 Benign neoplasm of transverse colon: Secondary | ICD-10-CM

## 2015-12-28 DIAGNOSIS — Z1211 Encounter for screening for malignant neoplasm of colon: Secondary | ICD-10-CM | POA: Diagnosis not present

## 2015-12-28 DIAGNOSIS — D122 Benign neoplasm of ascending colon: Secondary | ICD-10-CM | POA: Diagnosis not present

## 2015-12-28 DIAGNOSIS — Z1212 Encounter for screening for malignant neoplasm of rectum: Secondary | ICD-10-CM

## 2015-12-28 MED ORDER — SODIUM CHLORIDE 0.9 % IV SOLN: 500.0000 mL | INTRAVENOUS | Status: DC

## 2015-12-28 NOTE — Op Note (Signed)
[facility] Patient Name: Austin Glass Procedure Date: 12/28/2015 10:46 AM MRN: [id number] Endoscopist: Gatha Mayer , MD Age: 60 Referring MD:  Date of Birth: [birth year] Gender: Male Account #: [id number] Procedure:                Colonoscopy Indications:              Screening for colorectal malignant neoplasm, This                            is the patient's first colonoscopy Medicines:                Propofol per Anesthesia, Monitored Anesthesia Care Procedure:                Pre-Anesthesia Assessment:                           - Prior to the procedure, a History and Physical                            was performed, and patient medications and                            allergies were reviewed. The patient's tolerance of                            previous anesthesia was also reviewed. The risks                            and benefits of the procedure and the sedation                            options and risks were discussed with the patient.                            All questions were answered, and informed consent                            was obtained. Prior Anticoagulants: The patient has                            taken no previous anticoagulant or antiplatelet                            agents. ASA Grade Assessment: II - A patient with                            mild systemic disease. After reviewing the risks                            and benefits, the patient was deemed in                            satisfactory condition to undergo the procedure.  After obtaining informed consent, the colonoscope                            was passed under direct vision. Throughout the                            procedure, the patient's blood pressure, pulse, and                            oxygen saturations were monitored continuously. The                            Model CF-HQ190L [id number]) scope was introduced   through the anus and advanced to the the cecum,                            identified by appendiceal orifice and ileocecal                            valve. The colonoscopy was performed without                            difficulty. The patient tolerated the procedure                            well. The quality of the bowel preparation was                            good. The bowel preparation used was Miralax. The                            ileocecal valve, appendiceal orifice, and rectum                            were photographed. Scope In: 11:03:54 AM Scope Out: 11:26:06 AM Scope Withdrawal Time: 0 hours 20 minutes 22 seconds  Total Procedure Duration: 0 hours 22 minutes 12 seconds  Findings:                 The perianal and digital rectal examinations were                            normal. Pertinent negatives include normal prostate                            (size, shape, and consistency).                           Four sessile polyps were found in the transverse                            colon and ascending colon. The polyps were 2 to 5                            mm in size. These  polyps were removed with a cold                            snare. Resection and retrieval were complete.                            Verification of patient identification for the                            specimen was done. Estimated blood loss was minimal.                           Multiple large-mouthed diverticula were found in                            the left colon.                           The exam was otherwise without abnormality on                            direct and retroflexion views. Complications:            No immediate complications. Estimated Blood Loss:     Estimated blood loss was minimal. Impression:               - Four 2 to 5 mm polyps in the transverse colon and                            in the ascending colon, removed with a cold snare.                             Resected and retrieved.                           - Diverticulosis in the left colon.                           - The examination was otherwise normal on direct                            and retroflexion views. Recommendation:           - Patient has a contact number available for                            emergencies. The signs and symptoms of potential                            delayed complications were discussed with the                            patient. Return to normal activities tomorrow.                            Written discharge instructions were  provided to the                            patient.                           - Resume previous diet.                           - Continue present medications.                           - Repeat colonoscopy is recommended. The                            colonoscopy date will be determined after pathology                            results from today's exam become available for                            review. Gatha Mayer, MD 12/28/2015 11:36:46 AM This report has been signed electronically.

## 2015-12-28 NOTE — Progress Notes (Signed)
To PACU, vss patent aw report to rn 

## 2015-12-28 NOTE — Patient Instructions (Addendum)
I found and removed 4 small polyps today. You also have a condition called diverticulosis - common and not usually a problem. Please read the handout provided.  I will let you know pathology results and when to have another routine colonoscopy by mail.  I appreciate the opportunity to care for you. Gatha Mayer, MD, FACG    YOU HAD AN ENDOSCOPIC PROCEDURE TODAY AT [facility]:   Refer to the procedure report that was given to you for any specific questions about what was found during the examination.  If the procedure report does not answer your questions, please call your gastroenterologist to clarify.  If you requested that your care partner not be given the details of your procedure findings, then the procedure report has been included in a sealed envelope for you to review at your convenience later.  YOU SHOULD EXPECT: Some feelings of bloating in the abdomen. Passage of more gas than usual.  Walking can help get rid of the air that was put into your GI tract during the procedure and reduce the bloating. If you had a lower endoscopy (such as a colonoscopy or flexible sigmoidoscopy) you may notice spotting of blood in your stool or on the toilet paper. If you underwent a bowel prep for your procedure, you may not have a normal bowel movement for a few days.  Please Note:  You might notice some irritation and congestion in your nose or some drainage.  This is from the oxygen used during your procedure.  There is no need for concern and it should clear up in a day or so.  SYMPTOMS TO REPORT IMMEDIATELY:   Following lower endoscopy (colonoscopy or flexible sigmoidoscopy):  Excessive amounts of blood in the stool  Significant tenderness or worsening of abdominal pains  Swelling of the abdomen that is new, acute  Fever of 100F or higher    For urgent or emergent issues, a gastroenterologist can be reached at any hour by calling [phone].   DIET:  We do  recommend a small meal at first, but then you may proceed to your regular diet.  Drink plenty of fluids but you should avoid alcoholic beverages for 24 hours.  ACTIVITY:  You should plan to take it easy for the rest of today and you should NOT DRIVE or use heavy machinery until tomorrow (because of the sedation medicines used during the test).    FOLLOW UP: Our staff will call the number listed on your records the next business day following your procedure to check on you and address any questions or concerns that you may have regarding the information given to you following your procedure. If we do not reach you, we will leave a message.  However, if you are feeling well and you are not experiencing any problems, there is no need to return our call.  We will assume that you have returned to your regular daily activities without incident.  If any biopsies were taken you will be contacted by phone or by letter within the next 1-3 weeks.  Please call us at [phone] if you have not heard about the biopsies in 3 weeks.    SIGNATURES/CONFIDENTIALITY: You and/or your care partner have signed paperwork which will be entered into your electronic medical record.  These signatures attest to the fact that that the information above on your After Visit Summary has been reviewed and is understood.  Full responsibility of the confidentiality of this discharge  information lies with you and/or your care-partner.   Information on polyps and diverticulosis given to you today

## 2015-12-28 NOTE — Progress Notes (Signed)
Called to room to assist during endoscopic procedure.  Patient ID and intended procedure confirmed with present staff. Received instructions for my participation in the procedure from the performing physician.  

## 2015-12-29 ENCOUNTER — Telehealth: Payer: Self-pay

## 2015-12-29 NOTE — Telephone Encounter (Signed)
  Follow up Call-  Call back number 12/28/2015  Post procedure Call Back phone  # [phone]  Permission to leave phone message Yes  Some recent data might be hidden     Patient questions:  Do you have a fever, pain , or abdominal swelling? No. Pain Score  0 *  Have you tolerated food without any problems? Yes.    Have you been able to return to your normal activities? Yes.    Do you have any questions about your discharge instructions: Diet   No. Medications  No. Follow up visit  No.  Do you have questions or concerns about your Care? No.  Actions: * If pain score is 4 or above: No action needed, pain <4.

## 2016-01-04 ENCOUNTER — Encounter: Payer: Self-pay | Admitting: Internal Medicine

## 2016-01-04 DIAGNOSIS — Z86010 Personal history of colonic polyps: Secondary | ICD-10-CM

## 2016-01-04 DIAGNOSIS — Z860101 Personal history of adenomatous and serrated colon polyps: Secondary | ICD-10-CM

## 2016-01-04 HISTORY — DX: Personal history of colonic polyps: Z86.010

## 2016-01-04 HISTORY — DX: Personal history of adenomatous and serrated colon polyps: Z86.0101

## 2016-01-04 NOTE — Progress Notes (Signed)
4 small adenomas recall 2020

## 2017-06-28 IMAGING — CT CT MAXILLOFACIAL W/O CM
3 series · 16 of 40 positions shown, 19 images · non-contrast
Comparison: None.

CLINICAL DATA: Nasal polyps.  Headache.

EXAM:
CT MAXILLOFACIAL WITHOUT CONTRAST
TECHNIQUE: Multidetector CT imaging of the maxillofacial structures was
performed. Multiplanar CT image reconstructions were also generated.
A small metallic BB was placed on the right temple in order to
reliably differentiate right from left.

[Series 3: axial soft 1.25 · axial · 0.53mm/px · z∈[-62,+12]mm · 4 of 181 slices shown]
[im 21/181  brain]
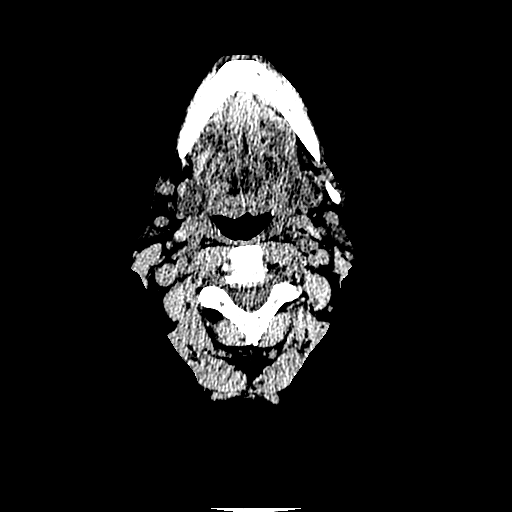
[im 41/181  brain]
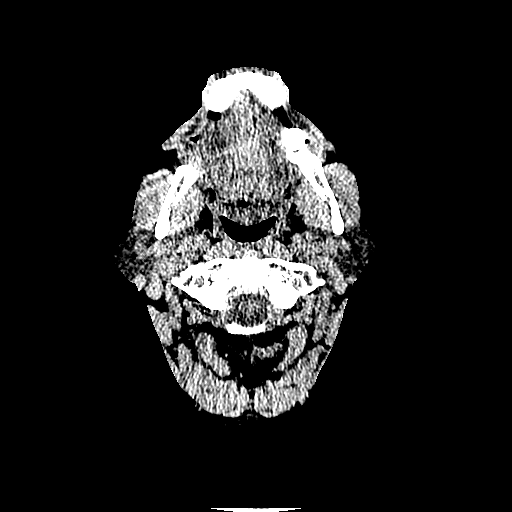
[im 61/181  brain]
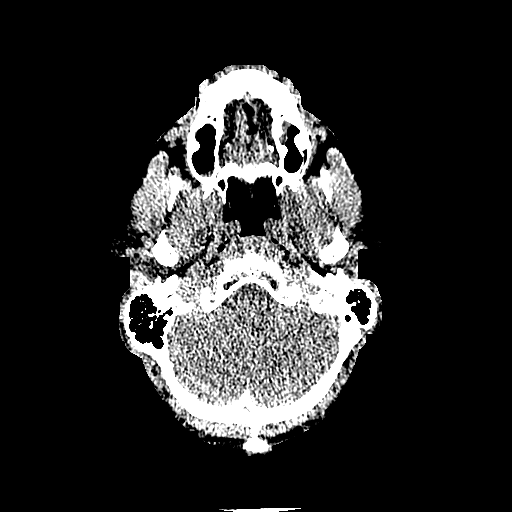
[im 81/181  brain]
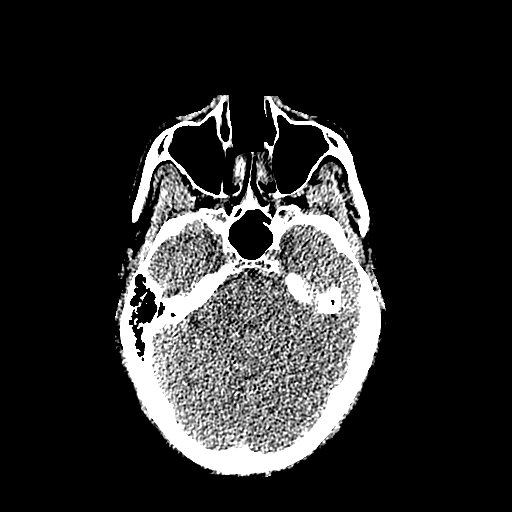

[Series 601: coronal facial · coronal · 0.53mm/px · 3 of 126 slices shown]
[im 42/126  bone]
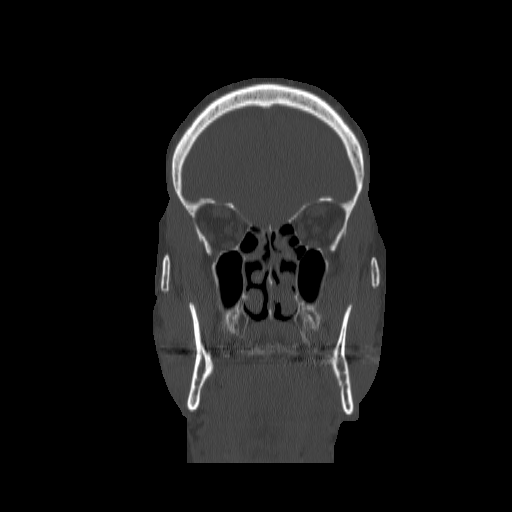
[im 56/126  bone]
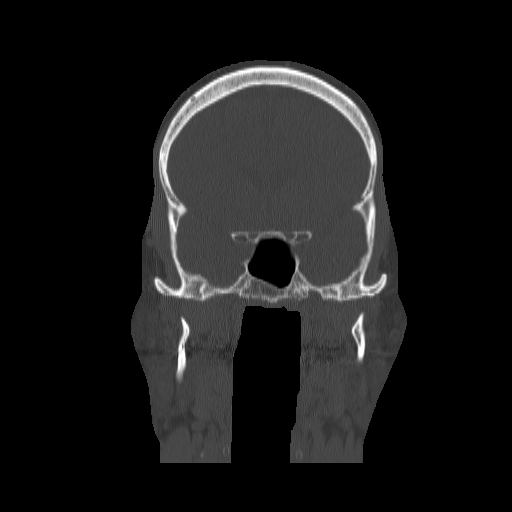
[im 70/126  bone]
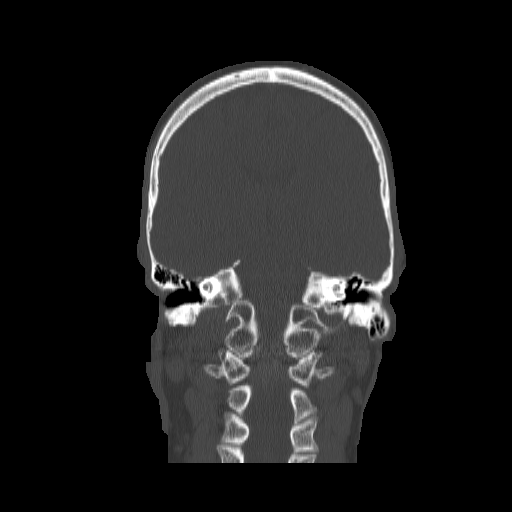

[Series 602: sagittal facial · sagittal · 0.53mm/px · 9 of 108 slices shown, 12 images]
[im 11/108  brain]
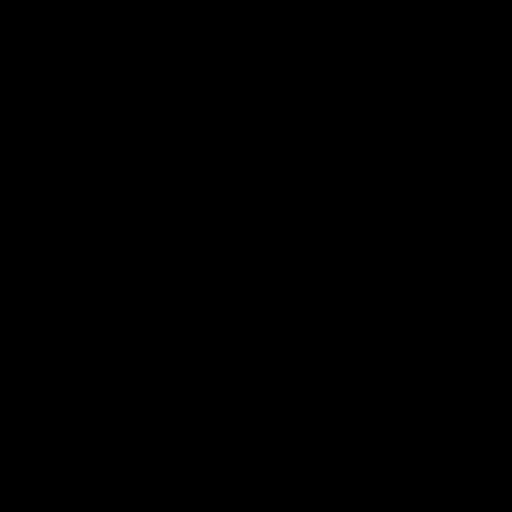
[im 11/108  bone]
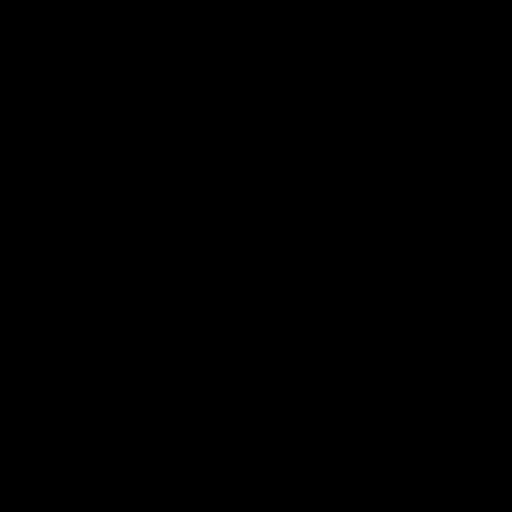
[im 22/108  bone]
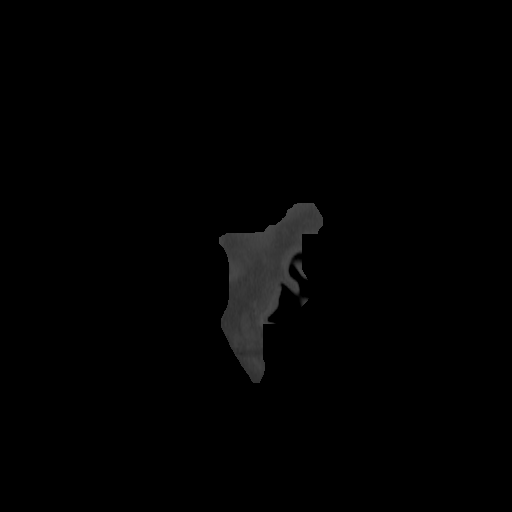
[im 33/108  bone]
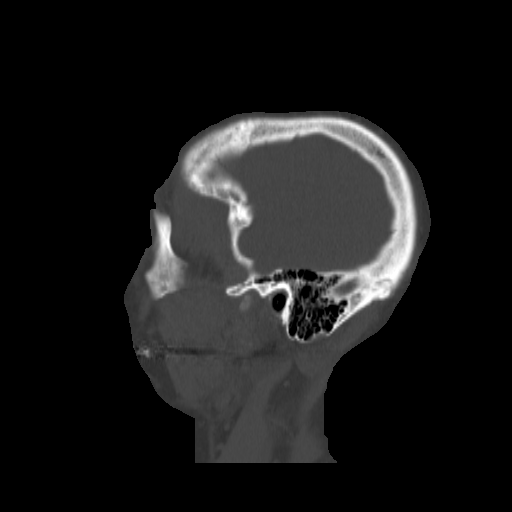
[im 43/108  bone]
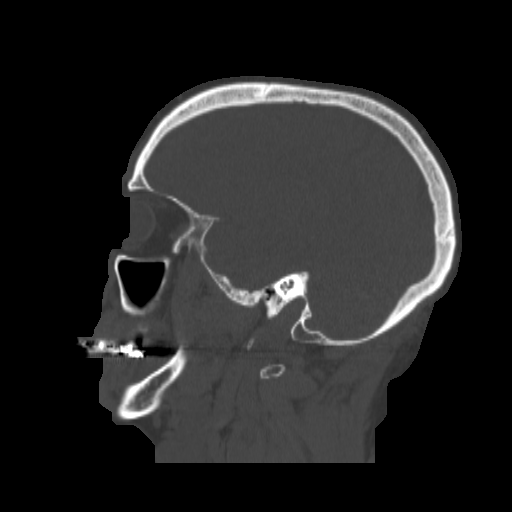
[im 54/108  brain]
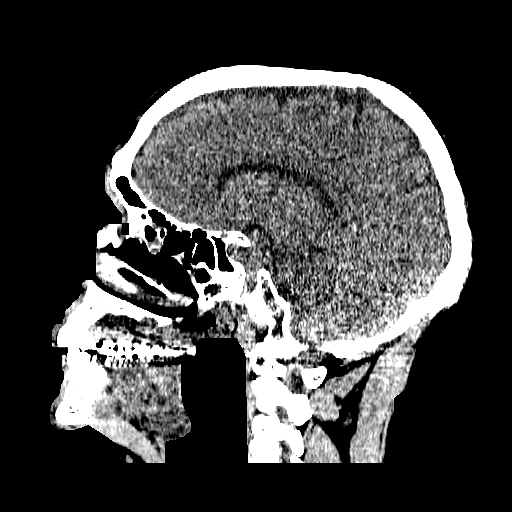
[im 54/108  bone]
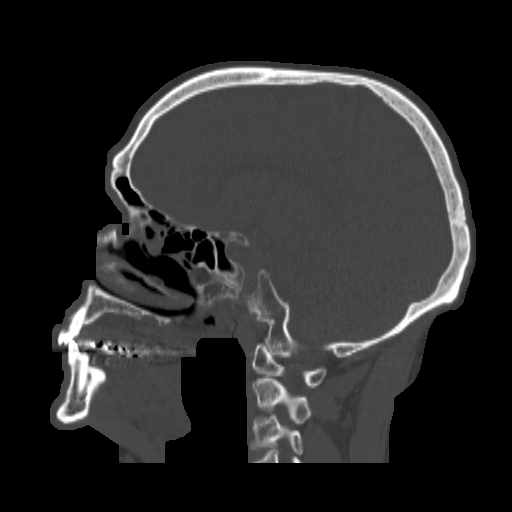
[im 65/108  bone]
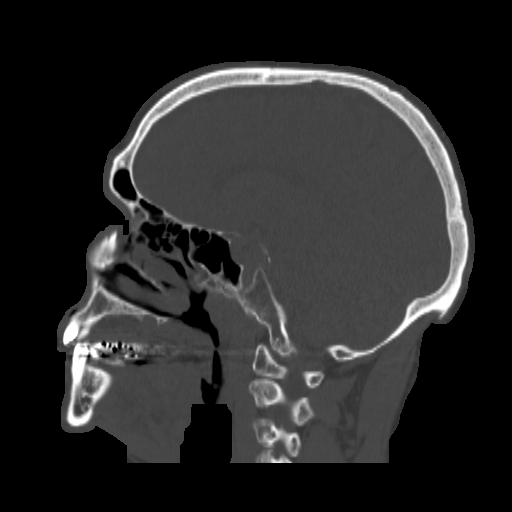
[im 75/108  bone]
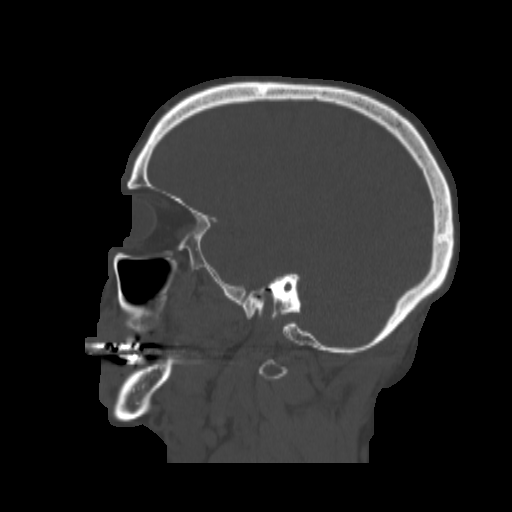
[im 86/108  bone]
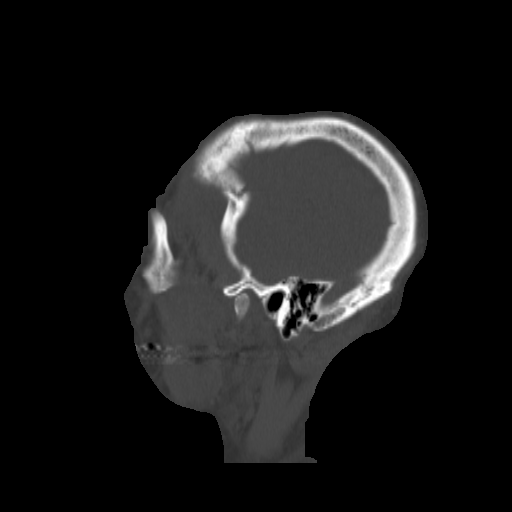
[im 97/108  brain]
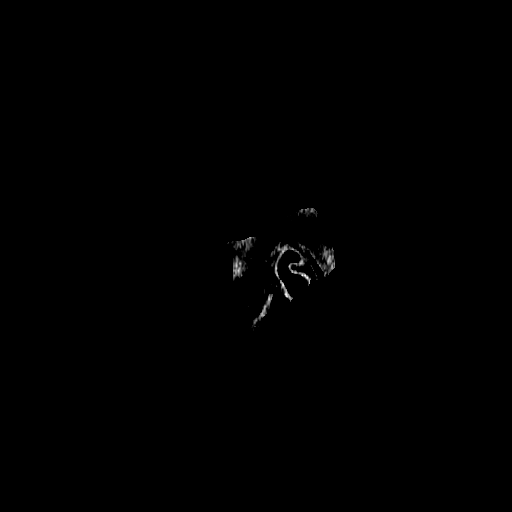
[im 97/108  bone]
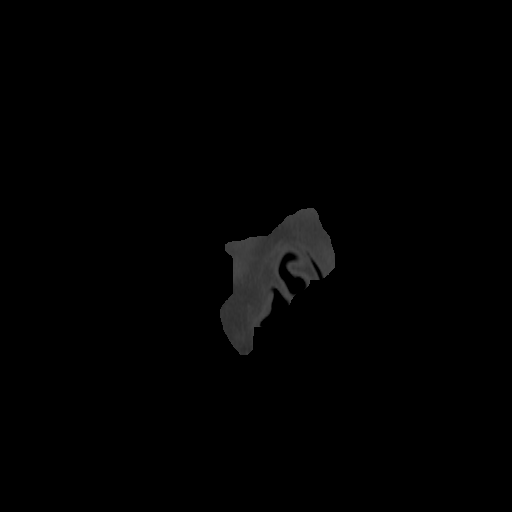

[16 of 40 positions shown; findings below may reference images not displayed]

FINDINGS: Osseous: No fracture or destructive process.

Orbits: Bilateral cataract resection.  No pathologic finding.

Sinuses: Polypoid soft tissue in the left nasal cavity combines with
mucosal thickening to obstruct left maxillary outflow. There is mild
mucosal thickening mainly on the floor of the left maxillary sinus.
Mucosal thickening also obscures the right maxillary outflow.
Vertical right uncinate and bony infundibular stenosis on the right.

Bilateral frontal ethmoidal recess effacement by mucosal thickening
with patchy secretions or mucosal thickening in the inferior frontal
sinuses.

Sphenoid sinus ostia are effaced by mucosal thickening.

No medial orbital wall dehiscence. Bilateral attempted of [place]
cell. No infraorbital air cell. Symmetric olfactory recess depth.
Carotid and optic canals are bone covered.

Soft tissues: Negative

Limited intracranial: Negative
IMPRESSION: 1. Left nasal cavity stenosis from polyp, filling most of the middle
meatus. Combined with mucosal thickening the left maxillary
infundibulum is obstructed.
2. Right maxillary infundibulum effacement by mucosal thickening,
likely with underlying bony stenosis.
3. Mucosal thickening effaces bilateral frontal ethmoidal and
sphenoid ethmoidal recesses.

## 2019-02-15 ENCOUNTER — Encounter: Payer: Self-pay | Admitting: Internal Medicine

## 2019-02-16 ENCOUNTER — Telehealth: Payer: Self-pay | Admitting: Internal Medicine

## 2019-02-16 NOTE — Telephone Encounter (Signed)
Called and spoke with patient regarding his recall colon. Patient stated he does not want to schedule at this time because he will be switch over to medicare.  Will call back later this year to schedule

## 2020-04-04 DIAGNOSIS — E785 Hyperlipidemia, unspecified: Secondary | ICD-10-CM | POA: Diagnosis not present

## 2020-06-15 DIAGNOSIS — B342 Coronavirus infection, unspecified: Secondary | ICD-10-CM | POA: Diagnosis not present

## 2020-11-19 ENCOUNTER — Other Ambulatory Visit: Payer: Self-pay

## 2020-11-19 ENCOUNTER — Ambulatory Visit
Admission: EM | Admit: 2020-11-19 | Discharge: 2020-11-19 | Disposition: A | Discharge status: Home / Self Care | Payer: Medicare HMO

## 2020-11-19 DIAGNOSIS — J069 Acute upper respiratory infection, unspecified: Secondary | ICD-10-CM | POA: Diagnosis not present

## 2020-11-19 DIAGNOSIS — R0602 Shortness of breath: Secondary | ICD-10-CM | POA: Diagnosis not present

## 2020-11-19 NOTE — ED Provider Notes (Addendum)
[facility]    CSN: [id number] Arrival date & time: 11/19/20  0815      History   Chief Complaint Chief Complaint  Patient presents with   Cough   Nasal Congestion    HPI Austin Glass is a 65 y.o. male.   Patient presents with 3-day history of nonproductive cough, nasal congestion, body aches.  Patient reports the body aches are severe.  Denies chest pain but does report intermittent shortness of breath especially with exertion.  Denies current shortness of breath.  Patient has been taking Tylenol, Advil, NyQuil, albuterol inhaler with minimal relief in symptoms.  Patient's significant other has similar symptoms.  Patient reports history of asthma.   Cough  Past Medical History:  Diagnosis Date   Anxiety    Hx of adenomatous colonic polyps 01/04/2016   Hypertrophy of nasal turbinates    Sinus disease    Smoker     Patient Active Problem List   Diagnosis Date Noted   Hx of adenomatous colonic polyps 01/04/2016    Past Surgical History:  Procedure Laterality Date   CARPAL TUNNEL RELEASE Left    ETHMOIDECTOMY Bilateral 12/05/2015   Procedure: BILATERAL TOTAL  ETHMOIDECTOMY, MAXILLARY OSTEA  ENLARGEMENT;  Surgeon: Rozetta Nunnery, MD;  Location: [place];  Service: ENT;  Laterality: Bilateral;   EYE SURGERY     cataract   HERNIA REPAIR     UHR   SINUS ENDO WITH FUSION Bilateral 12/05/2015   Procedure: FUCTIONAL  ENDOSCOPIC SINUS SURGERY WITH FUSION NAVIGATION;  Surgeon: Rozetta Nunnery, MD;  Location: [place];  Service: ENT;  Laterality: Bilateral;   TURBINATE REDUCTION Bilateral 12/05/2015   Procedure: BILATERAL TURBINATE REDUCTION;  Surgeon: Rozetta Nunnery, MD;  Location: [place];  Service: ENT;  Laterality: Bilateral;   ULNAR NERVE REPAIR Left    decompression       Home Medications    Prior to Admission medications   Medication Sig Start Date End Date Taking? Authorizing  Provider  BREZTRI AEROSPHERE 160-9-4.8 MCG/ACT AERO Inhale into the lungs. 10/27/20   [provider]  fluticasone (FLONASE) 50 MCG/ACT nasal spray as needed. 11/03/15   [provider]  traZODone (DESYREL) 150 MG tablet Take by mouth at bedtime.    [provider]    Family History Family History  Problem Relation Age of Onset   Liver cancer Mother    Colon polyps Father    Colon cancer Neg Hx    Stomach cancer Neg Hx    Rectal cancer Neg Hx    Pancreatic cancer Neg Hx     Social History Social History   Tobacco Use   Smoking status: Former    Packs/day: 1.00    Types: Cigarettes    Quit date: 07/28/2020    Years since quitting: 0.3   Smokeless tobacco: Never  Substance Use Topics   Alcohol use: Yes    Comment: once monthly   Drug use: No     Allergies   Penicillins   Review of Systems Review of Systems Per HPI  Physical Exam Triage Vital Signs ED Triage Vitals  Enc Vitals Group     BP 11/19/20 0830 120/67     Pulse Rate 11/19/20 0830 89     Resp 11/19/20 0830 18     Temp 11/19/20 0830 99.5 F (37.5 C)     Temp Source 11/19/20 0830 Oral     SpO2 11/19/20 0830 92 %  Weight --      Height --      Head Circumference --      Peak Flow --      Pain Score 11/19/20 0832 5     Pain Loc --      Pain Edu? --      Excl. in [place]? --    No data found.  Updated Vital Signs BP 120/67 (BP Location: Left Arm)   Pulse 89   Temp 99.5 F (37.5 C) (Oral)   Resp 18   SpO2 92%   Visual Acuity Right Eye Distance:   Left Eye Distance:   Bilateral Distance:    Right Eye Near:   Left Eye Near:    Bilateral Near:     Physical Exam Constitutional:      General: He is not in acute distress.    Appearance: Normal appearance. He is not toxic-appearing or diaphoretic.  HENT:     Head: Normocephalic and atraumatic.     Right Ear: Tympanic membrane and ear canal normal.     Left Ear: Tympanic membrane and ear canal normal.     Nose:  Congestion present.     Mouth/Throat:     Mouth: Mucous membranes are moist.     Pharynx: No posterior oropharyngeal erythema.  Eyes:     Extraocular Movements: Extraocular movements intact.     Conjunctiva/sclera: Conjunctivae normal.     Pupils: Pupils are equal, round, and reactive to light.  Cardiovascular:     Rate and Rhythm: Normal rate and regular rhythm.     Pulses: Normal pulses.     Heart sounds: Normal heart sounds.  Pulmonary:     Effort: Pulmonary effort is normal. No respiratory distress.     Breath sounds: Normal breath sounds. No stridor. No wheezing or rhonchi.  Abdominal:     General: Abdomen is flat. Bowel sounds are normal.     Palpations: Abdomen is soft.  Musculoskeletal:        General: Normal range of motion.     Cervical back: Normal range of motion.  Skin:    General: Skin is warm and dry.  Neurological:     General: No focal deficit present.     Mental Status: He is alert and oriented to person, place, and time. Mental status is at baseline.  Psychiatric:        Mood and Affect: Mood normal.        Behavior: Behavior normal.     UC Treatments / Results  Labs (all labs ordered are listed, but only abnormal results are displayed) Labs Reviewed - No data to display  EKG   Radiology No results found.  Procedures Procedures (including critical care time)  Medications Ordered in UC Medications - No data to display  Initial Impression / Assessment and Plan / UC Course  I have reviewed the triage vital signs and the nursing notes.  Pertinent labs & imaging results that were available during my care of the patient were reviewed by me and considered in my medical decision making (see chart for details).     Patient's saturation ranging from 88% to 90% during physical exam and triage.  Advised patient that he will need to go to the hospital for further evaluation and management given oxygen saturation.  Patient was advised that he should to go  to the hospital via EMS but patient declined.  Risks associated with not going to hospital via EMS were discussed with patient.  Patient was agreeable to go to the hospital via self transport.  Patient left urgent care to go to the hospital. Final Clinical Impressions(s) / UC Diagnoses   Final diagnoses:  Viral upper respiratory tract infection with cough  Shortness of breath     Discharge Instructions      Please go to the hospital as soon as you leave urgent care for further evaluation and management.     ED Prescriptions   None    PDMP not reviewed this encounter.   Teodora Medici, [place] 11/19/20 [place], [place], [place] 11/19/20 [phone]

## 2020-11-19 NOTE — Discharge Instructions (Signed)
Please go to the hospital as soon as you leave urgent care for further evaluation and management. 

## 2020-11-19 NOTE — ED Triage Notes (Signed)
3 day h/o cough, congestion and body aches. Pt reports pain with ambulation at times due to body aches. Has been taking tylenol and advil without relief. Also taking Nyquil and using his inhaler with some relief. Pt is not covid vaccinated. Pt's girlfriend is sick.

## 2020-11-20 DIAGNOSIS — B9789 Other viral agents as the cause of diseases classified elsewhere: Secondary | ICD-10-CM | POA: Diagnosis not present

## 2020-11-20 DIAGNOSIS — B349 Viral infection, unspecified: Secondary | ICD-10-CM | POA: Diagnosis not present

## 2020-11-20 DIAGNOSIS — J189 Pneumonia, unspecified organism: Secondary | ICD-10-CM | POA: Diagnosis not present

## 2020-12-28 ENCOUNTER — Ambulatory Visit
Admission: RE | Admit: 2020-12-28 | Discharge: 2020-12-28 | Disposition: A | Discharge status: Home / Self Care | Payer: Medicare HMO | Source: Ambulatory Visit | Attending: Family Medicine | Admitting: Family Medicine

## 2020-12-28 ENCOUNTER — Other Ambulatory Visit: Payer: Self-pay | Admitting: Family Medicine

## 2020-12-28 ENCOUNTER — Other Ambulatory Visit: Payer: Self-pay

## 2020-12-28 DIAGNOSIS — R079 Chest pain, unspecified: Secondary | ICD-10-CM | POA: Diagnosis not present

## 2020-12-28 DIAGNOSIS — R531 Weakness: Secondary | ICD-10-CM

## 2020-12-28 DIAGNOSIS — R0602 Shortness of breath: Secondary | ICD-10-CM

## 2020-12-28 DIAGNOSIS — J129 Viral pneumonia, unspecified: Secondary | ICD-10-CM | POA: Diagnosis not present

## 2020-12-28 DIAGNOSIS — R059 Cough, unspecified: Secondary | ICD-10-CM | POA: Diagnosis not present

## 2020-12-28 DIAGNOSIS — R5383 Other fatigue: Secondary | ICD-10-CM | POA: Diagnosis not present

## 2021-02-21 ENCOUNTER — Ambulatory Visit: Payer: Medicare HMO | Admitting: Pulmonary Disease

## 2021-02-21 ENCOUNTER — Other Ambulatory Visit: Payer: Self-pay

## 2021-02-21 ENCOUNTER — Encounter: Payer: Self-pay | Admitting: Pulmonary Disease

## 2021-02-21 VITALS — BP 122/70 | HR 72 | Temp 98.4°F | Ht 67.0 in | Wt 178.2 lb

## 2021-02-21 DIAGNOSIS — R0600 Dyspnea, unspecified: Secondary | ICD-10-CM

## 2021-02-21 NOTE — Progress Notes (Signed)
Austin Glass    [id number]    1955-10-28  Primary Care Physician:Elkins, Curt Jews, MD  Referring Physician: Leonard Downing, MD [address] [place],  [place] [address]  Chief complaint: Consult for dyspnea  HPI: 66 year old smoker with history of asthma He had an episode of pneumonia in November from a chest x-ray at primary care.  I do not have those images to review.  Repeat chest x-ray 12/29/2020 showed bronchitis changes.  He was treated with doxycycline and prednisone and started on breztri.  Referred here for further evaluation  Overall he is improved but continues to have mild cough on exertion with chest congestion.  Pets: No pets Occupation: Cabin crew Exposures: Has down pillows.  Note has a damp basement and possible mold.  No hot tubs, Jacuzzis  Smoking history: Half to 1 pack/day for 20 years.  Quit smoking in July 2022 Travel history: No significant travel history Relevant family history: Dad died of emphysema.  He was a smoker.  Outpatient Encounter Medications as of 02/21/2021  Medication Sig   albuterol (VENTOLIN HFA) 108 (90 Base) MCG/ACT inhaler Inhale 2 puffs into the lungs every 6 (six) hours as needed for wheezing or shortness of breath.   BREZTRI AEROSPHERE 160-9-4.8 MCG/ACT AERO Inhale into the lungs.   traZODone (DESYREL) 150 MG tablet Take by mouth at bedtime.   [DISCONTINUED] fluticasone (FLONASE) 50 MCG/ACT nasal spray as needed.   Facility-Administered Encounter Medications as of 02/21/2021  Medication   0.9 %  sodium chloride infusion    Allergies as of 02/21/2021 - Review Complete 02/21/2021  Allergen Reaction Noted   Penicillins Rash 11/29/2015    Past Medical History:  Diagnosis Date   Anxiety    Hx of adenomatous colonic polyps 01/04/2016   Hypertrophy of nasal turbinates    Sinus disease    Smoker     Past Surgical History:  Procedure Laterality Date   CARPAL TUNNEL RELEASE Left    ETHMOIDECTOMY  Bilateral 12/05/2015   Procedure: BILATERAL TOTAL  ETHMOIDECTOMY, MAXILLARY OSTEA  ENLARGEMENT;  Surgeon: Rozetta Nunnery, MD;  Location: [place];  Service: ENT;  Laterality: Bilateral;   EYE SURGERY     cataract   HERNIA REPAIR     UHR   SINUS ENDO WITH FUSION Bilateral 12/05/2015   Procedure: FUCTIONAL  ENDOSCOPIC SINUS SURGERY WITH FUSION NAVIGATION;  Surgeon: Rozetta Nunnery, MD;  Location: [place];  Service: ENT;  Laterality: Bilateral;   TURBINATE REDUCTION Bilateral 12/05/2015   Procedure: BILATERAL TURBINATE REDUCTION;  Surgeon: Rozetta Nunnery, MD;  Location: [place];  Service: ENT;  Laterality: Bilateral;   ULNAR NERVE REPAIR Left    decompression    Family History  Problem Relation Age of Onset   Liver cancer Mother    Colon polyps Father    Colon cancer Neg Hx    Stomach cancer Neg Hx    Rectal cancer Neg Hx    Pancreatic cancer Neg Hx     Social History   Socioeconomic History   Marital status: Divorced    Spouse name: Not on file   Number of children: Not on file   Years of education: Not on file   Highest education level: Not on file  Occupational History   Not on file  Tobacco Use   Smoking status: Former    Packs/day: 1.00    Types: Cigarettes    Quit  date: 07/28/2020    Years since quitting: 0.5   Smokeless tobacco: Never  Substance and Sexual Activity   Alcohol use: Yes    Comment: once monthly   Drug use: No   Sexual activity: Not on file  Other Topics Concern   Not on file  Social History Narrative   Not on file   Social Determinants of Health   Financial Resource Strain: Not on file  Food Insecurity: Not on file  Transportation Needs: Not on file  Physical Activity: Not on file  Stress: Not on file  Social Connections: Not on file  Intimate Partner Violence: Not on file    Review of systems: Review of Systems  Constitutional: Negative for fever and chills.  HENT:  Negative.   Eyes: Negative for blurred vision.  Respiratory: as per HPI  Cardiovascular: Negative for chest pain and palpitations.  Gastrointestinal: Negative for vomiting, diarrhea, blood per rectum. Genitourinary: Negative for dysuria, urgency, frequency and hematuria.  Musculoskeletal: Negative for myalgias, back pain and joint pain.  Skin: Negative for itching and rash.  Neurological: Negative for dizziness, tremors, focal weakness, seizures and loss of consciousness.  Endo/Heme/Allergies: Negative for environmental allergies.  Psychiatric/Behavioral: Negative for depression, suicidal ideas and hallucinations.  All other systems reviewed and are negative.  Physical Exam: Blood pressure 122/70, pulse 72, temperature 98.4 F (36.9 C), temperature source Oral, height 5\' 7"  (1.702 m), weight 178 lb 3.2 oz (80.8 kg), SpO2 94 %. Gen:      No acute distress HEENT:  EOMI, sclera anicteric Neck:     No masses; no thyromegaly Lungs:    Clear to auscultation bilaterally; normal respiratory effort CV:         Regular rate and rhythm; no murmurs Abd:      + bowel sounds; soft, non-tender; no palpable masses, no distension Ext:    No edema; adequate peripheral perfusion Skin:      Warm and dry; no rash Neuro: alert and oriented x 3 Psych: normal mood and affect  Data Reviewed: Imaging: Chest x-ray 12/28/2020-coarse perihilar bronchovascular markings.  I have reviewed the images personally.  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  Also has history of asthma though CBC recently did not show peripheral eosinophils.  Exposures notable for down pillows and possible mold  Is currently on breztri and will continue the same I have advised work-up with PFTs and possible CT scan but he is reluctant to do any work-up. He is agreed for PFTs after his  upcoming marriage in May/June.  I will see him back in clinic for review of results and further work-up as needed  Ex-smoker Is a candidate for screening CT chest but does not want to go through now.  We will need to reassess at return visit  Plan/Recommendations: PFTs and follow-up in clinic  Marshell Garfinkel MD [facility] 02/21/2021, 3:03 PM  CC: Leonard Downing, *

## 2021-02-21 NOTE — Patient Instructions (Signed)
Continue the breztri inhaler Congratulations on quitting smoking We get PFTs and follow-up in May/June

## 2021-09-10 ENCOUNTER — Ambulatory Visit: Payer: Medicare HMO | Admitting: Podiatry

## 2021-09-10 DIAGNOSIS — Z79899 Other long term (current) drug therapy: Secondary | ICD-10-CM | POA: Diagnosis not present

## 2021-09-10 DIAGNOSIS — B351 Tinea unguium: Secondary | ICD-10-CM

## 2021-09-10 NOTE — Patient Instructions (Signed)
Terbinafine Tablets What is this medication? TERBINAFINE (TER bin a feen) treats fungal infections of the nails. It belongs to a group of medications called antifungals. It will not treat infections caused by bacteria or viruses. This medicine may be used for other purposes; ask your health care provider or pharmacist if you have questions. COMMON BRAND NAME(S): Lamisil, Terbinex What should I tell my care team before I take this medication? They need to know if you have any of these conditions: Liver disease An unusual or allergic reaction to terbinafine, other medications, foods, dyes, or preservatives Pregnant or trying to get pregnant Breast-feeding How should I use this medication? Take this medication by mouth with water. Take it as directed on the prescription label at the same time every day. You can take it with or without food. If it upsets your stomach, take it with food. Keep taking it unless your care team tells you to stop. A special MedGuide will be given to you by the pharmacist with each prescription and refill. Be sure to read this information carefully each time. Talk to your care team regarding the use of this medication in children. Special care may be needed. Overdosage: If you think you have taken too much of this medicine contact a poison control center or emergency room at once. NOTE: This medicine is only for you. Do not share this medicine with others. What if I miss a dose? If you miss a dose, take it as soon as you can unless it is more than 4 hours late. If it is more than 4 hours late, skip the missed dose. Take the next dose at the normal time. What may interact with this medication? Do not take this medication with any of the following: Pimozide Thioridazine This medication may also interact with the following: Beta blockers Caffeine Certain medications for mental health conditions Cimetidine Cyclosporine Medications for fungal infections like fluconazole  and ketoconazole Medications for irregular heartbeat like amiodarone, flecainide and propafenone Rifampin Warfarin This list may not describe all possible interactions. Give your health care provider a list of all the medicines, herbs, non-prescription drugs, or dietary supplements you use. Also tell them if you smoke, drink alcohol, or use illegal drugs. Some items may interact with your medicine. What should I watch for while using this medication? Visit your care team for regular checks on your progress. You may need blood work while you are taking this medication. It may be some time before you see the benefit from this medication. This medication may cause serious skin reactions. They can happen weeks to months after starting the medication. Contact your care team right away if you notice fevers or flu-like symptoms with a rash. The rash may be red or purple and then turn into blisters or peeling of the skin. Or, you might notice a red rash with swelling of the face, lips or lymph nodes in your neck or under your arms. This medication can make you more sensitive to the sun. Keep out of the sun, If you cannot avoid being in the sun, wear protective clothing and sunscreen. Do not use sun lamps or tanning beds/booths. What side effects may I notice from receiving this medication? Side effects that you should report to your care team as soon as possible: Allergic reactions--skin rash, itching, hives, swelling of the face, lips, tongue, or throat Change in sense of smell Change in taste Infection--fever, chills, cough, or sore throat Liver injury--right upper belly pain, loss of appetite, nausea,   light-colored stool, dark yellow or brown urine, yellowing skin or eyes, unusual weakness or fatigue Low red blood cell level--unusual weakness or fatigue, dizziness, headache, trouble breathing Lupus-like syndrome--joint pain, swelling, or stiffness, butterfly-shaped rash on the face, rashes that get worse  in the sun, fever, unusual weakness or fatigue Rash, fever, and swollen lymph nodes Redness, blistering, peeling, or loosening of the skin, including inside the mouth Unusual bruising or bleeding Worsening mood, feelings of depression Side effects that usually do not require medical attention (report to your care team if they continue or are bothersome): Diarrhea Gas Headache Nausea Stomach pain Upset stomach This list may not describe all possible side effects. Call your doctor for medical advice about side effects. You may report side effects to FDA at 1-800-FDA-1088. Where should I keep my medication? Keep out of the reach of children and pets. Store between 20 and 25 degrees C (68 and 77 degrees F). Protect from light. Get rid of any unused medication after the expiration date. To get rid of medications that are no longer needed or have expired: Take the medication to a medication take-back program. Check with your pharmacy or law enforcement to find a location. If you cannot return the medication, check the label or package insert to see if the medication should be thrown out in the garbage or flushed down the toilet. If you are not sure, ask your care team. If it is safe to put it in the trash, take the medication out of the container. Mix the medication with cat litter, dirt, coffee grounds, or other unwanted substance. Seal the mixture in a bag or container. Put it in the trash. NOTE: This sheet is a summary. It may not cover all possible information. If you have questions about this medicine, talk to your doctor, pharmacist, or health care provider.  2023 Elsevier/Gold Standard (2020-08-08 00:00:00)  

## 2021-09-10 NOTE — Progress Notes (Unsigned)
Subjective:   Patient ID: Austin Glass, male   DOB: 66 y.o.   MRN: [id number]   HPI Chief Complaint  Patient presents with   Nail Problem    Pt came in for bilateral nail fungus, which started 30 years ago, pt denies any pain, TX: OTC  Nail fungus gel,    Rare alcohol use   ROS      Objective:  Physical Exam  ***     Assessment:  ***     Plan:  ***

## 2021-09-14 ENCOUNTER — Other Ambulatory Visit: Payer: Self-pay | Admitting: Podiatry

## 2021-09-14 DIAGNOSIS — Z79899 Other long term (current) drug therapy: Secondary | ICD-10-CM

## 2021-09-14 LAB — CBC WITH DIFFERENTIAL/PLATELET
Absolute Monocytes: 1089 cells/uL — ABNORMAL HIGH (ref 200–950)
Basophils Absolute: 85 cells/uL (ref 0–200)
Basophils Relative: 0.7 %
Eosinophils Absolute: 157 cells/uL (ref 15–500)
Eosinophils Relative: 1.3 %
HCT: 46.3 % (ref 38.5–50.0)
Hemoglobin: 15.8 g/dL (ref 13.2–17.1)
Lymphs Abs: 2105 cells/uL (ref 850–3900)
MCH: 32.3 pg (ref 27.0–33.0)
MCHC: 34.1 g/dL (ref 32.0–36.0)
MCV: 94.7 fL (ref 80.0–100.0)
MPV: 9.1 fL (ref 7.5–12.5)
Monocytes Relative: 9 %
Neutro Abs: 8664 cells/uL — ABNORMAL HIGH (ref 1500–7800)
Neutrophils Relative %: 71.6 %
Platelets: 367 10*3/uL (ref 140–400)
RBC: 4.89 10*6/uL (ref 4.20–5.80)
RDW: 12.8 % (ref 11.0–15.0)
Total Lymphocyte: 17.4 %
WBC: 12.1 10*3/uL — ABNORMAL HIGH (ref 3.8–10.8)

## 2021-09-14 LAB — HEPATIC FUNCTION PANEL
AG Ratio: 2 (calc) (ref 1.0–2.5)
ALT: 15 U/L (ref 9–46)
AST: 14 U/L (ref 10–35)
Albumin: 4.3 g/dL (ref 3.6–5.1)
Alkaline phosphatase (APISO): 68 U/L (ref 35–144)
Bilirubin, Direct: 0.1 mg/dL (ref 0.0–0.2)
Globulin: 2.2 g/dL (calc) (ref 1.9–3.7)
Indirect Bilirubin: 0.3 mg/dL (calc) (ref 0.2–1.2)
Total Bilirubin: 0.4 mg/dL (ref 0.2–1.2)
Total Protein: 6.5 g/dL (ref 6.1–8.1)

## 2021-09-14 MED ORDER — TERBINAFINE HCL 250 MG PO TABS: 250.0000 mg | ORAL_TABLET | Freq: Every day | ORAL | 0 refills | Status: DC

## 2021-11-21 ENCOUNTER — Ambulatory Visit (INDEPENDENT_AMBULATORY_CARE_PROVIDER_SITE_OTHER): Payer: Medicare HMO | Admitting: Pulmonary Disease

## 2021-11-21 DIAGNOSIS — R0600 Dyspnea, unspecified: Secondary | ICD-10-CM

## 2021-11-21 LAB — PULMONARY FUNCTION TEST
DL/VA % pred: 59 %
DL/VA: 2.48 ml/min/mmHg/L
DLCO cor % pred: 54 %
DLCO cor: 13.12 ml/min/mmHg
DLCO unc % pred: 54 %
DLCO unc: 13.12 ml/min/mmHg
FEF 25-75 Post: 0.5 L/sec
FEF 25-75 Pre: 0.45 L/sec
FEF2575-%Change-Post: 10 %
FEF2575-%Pred-Post: 21 %
FEF2575-%Pred-Pre: 19 %
FEV1-%Change-Post: 1 %
FEV1-%Pred-Post: 32 %
FEV1-%Pred-Pre: 32 %
FEV1-Post: 0.98 L
FEV1-Pre: 0.96 L
FEV1FVC-%Change-Post: -3 %
FEV1FVC-%Pred-Pre: 52 %
FEV6-%Change-Post: 3 %
FEV6-%Pred-Post: 65 %
FEV6-%Pred-Pre: 63 %
FEV6-Post: 2.5 L
FEV6-Pre: 2.42 L
FEV6FVC-%Change-Post: -1 %
FEV6FVC-%Pred-Post: 102 %
FEV6FVC-%Pred-Pre: 104 %
FVC-%Change-Post: 5 %
FVC-%Pred-Post: 64 %
FVC-%Pred-Pre: 60 %
FVC-Post: 2.59 L
FVC-Pre: 2.45 L
Post FEV1/FVC ratio: 38 %
Post FEV6/FVC ratio: 97 %
Pre FEV1/FVC ratio: 39 %
Pre FEV6/FVC Ratio: 99 %
RV % pred: 233 %
RV: 5.19 L
TLC % pred: 131 %
TLC: 8.41 L

## 2021-11-21 NOTE — Patient Instructions (Signed)
Full PFT performed today. °

## 2021-11-21 NOTE — Progress Notes (Signed)
Full PFT performed today. °

## 2021-11-30 ENCOUNTER — Telehealth: Payer: Self-pay | Admitting: Pulmonary Disease

## 2021-11-30 NOTE — Telephone Encounter (Signed)
Called and spoke to patient and informed him of his PFT results. Pt verbalized understanding. Nothing further needed

## 2021-12-03 ENCOUNTER — Ambulatory Visit: Payer: Medicare HMO | Admitting: Internal Medicine

## 2021-12-03 ENCOUNTER — Encounter: Payer: Self-pay | Admitting: Internal Medicine

## 2021-12-03 VITALS — BP 104/60 | HR 80 | Ht 66.0 in | Wt 170.2 lb

## 2021-12-03 DIAGNOSIS — K920 Hematemesis: Secondary | ICD-10-CM

## 2021-12-03 DIAGNOSIS — F17200 Nicotine dependence, unspecified, uncomplicated: Secondary | ICD-10-CM

## 2021-12-03 DIAGNOSIS — Z860101 Personal history of adenomatous and serrated colon polyps: Secondary | ICD-10-CM

## 2021-12-03 DIAGNOSIS — K625 Hemorrhage of anus and rectum: Secondary | ICD-10-CM

## 2021-12-03 DIAGNOSIS — K219 Gastro-esophageal reflux disease without esophagitis: Secondary | ICD-10-CM

## 2021-12-03 DIAGNOSIS — Z86010 Personal history of colonic polyps: Secondary | ICD-10-CM

## 2021-12-03 DIAGNOSIS — M6208 Separation of muscle (nontraumatic), other site: Secondary | ICD-10-CM

## 2021-12-03 DIAGNOSIS — R053 Chronic cough: Secondary | ICD-10-CM

## 2021-12-03 DIAGNOSIS — R1319 Other dysphagia: Secondary | ICD-10-CM

## 2021-12-03 MED ORDER — OMEPRAZOLE 40 MG PO CPDR: 40.0000 mg | DELAYED_RELEASE_CAPSULE | Freq: Every day | ORAL | 3 refills | Status: DC

## 2021-12-03 NOTE — Patient Instructions (Signed)
You have been scheduled for an endoscopy and colonoscopy. Please follow the written instructions given to you at your visit today. Please pick up your prep supplies at the pharmacy within the next 1-3 days. If you use inhalers (even only as needed), please bring them with you on the day of your procedure.   Due to recent changes in healthcare laws, you may see the results of your imaging and laboratory studies on MyChart before your provider has had a chance to review them.  We understand that in some cases there may be results that are confusing or concerning to you. Not all laboratory results come back in the same time frame and the provider may be waiting for multiple results in order to interpret others.  Please give [place] 48 hours in order for your provider to thoroughly review all the results before contacting the office for clarification of your results.   We have provided you with a GERD handout to read.   I appreciate the opportunity to care for you. Silvano Rusk, MD, [facility]

## 2021-12-03 NOTE — Progress Notes (Signed)
Austin Glass 66 y.o. [birth year] [id number]  Assessment & Plan:   Encounter Diagnoses  Name Primary?   Rectal bleeding Yes   Hx of adenomatous colonic polyps    Coffee ground emesis    Gastroesophageal reflux disease, unspecified whether esophagitis present    Chronic cough    Esophageal dysphagia    Smoker    Diastasis recti     Evaluate upper GI symptoms with EGD, and rectal bleeding and history of colon polyps with colonoscopy.  Chronic cough could be related to GERD but he has a whole host of pulmonary issues that are probably driving that.  Mild questionable dysphagia reported as well.  We will see if PPI helps it, I have started omeprazole.  GERD handout provided as well.  Further plans pending the above evaluation and clinical course.  The risks and benefits as well as alternatives of endoscopic procedure(s) have been discussed and reviewed. All questions answered. The patient agrees to proceed.  I explained what a diastases recti is and how he does not have a hernia as he suspected.  I had meant to give him a handout on this and I overlooked that we can catch up on that at next appointment.  I have emphasized the need to quit smoking again (he had quit as of 2022 but has resumed a few cigarettes a day) Meds ordered this encounter  Medications   omeprazole (PRILOSEC) 40 MG capsule    Sig: Take 1 capsule (40 mg total) by mouth daily before breakfast.    Dispense:  90 capsule    Refill:  3   CC: Leonard Downing, MD  Subjective:   Chief Complaint: Vomiting and reflux and rectal bleeding  HPI 66 year old white man with COPD but not on oxygen, who has been having some intermittent blood in the toilet, bright red blood rectal bleeding.  This is off and on and intermittent a few times a year.  No change in bowel habits.  He also has a history of 4 small adenomas in 2017 and was recommended for repeat colonoscopy in 2020 but that has not occurred yet.  He and his wife  (in the exam room) reports that he has a lot of heartburn he is taking a lot of Mylanta or Tums for at this time and will intermittently vomit some dark coffee-ground material.  It seems like when that occurs he will have some diarrheal stools as well.  There may be some mild dysphagia.  CBC at Dr. Alanson Puls office on 09/24/2021 showed a normal hemoglobin and hematocrit his white count was 16.8 at the time.  CMET was normal.  TSH slightly low at 0.444 with bottom limit of normal 0.509.  GI review of systems otherwise negative except that he wonders if he has an abdominal wall hernia.  Wt Readings from Last 3 Encounters:  12/03/21 170 lb 4 oz (77.2 kg)  02/21/21 178 lb 3.2 oz (80.8 kg)  12/28/15 198 lb (89.8 kg)    Allergies  Allergen Reactions   Penicillins Rash   Current Meds  Medication Sig   albuterol (VENTOLIN HFA) 108 (90 Base) MCG/ACT inhaler Inhale 2 puffs into the lungs every 6 (six) hours as needed for wheezing or shortness of breath.   BREZTRI AEROSPHERE 160-9-4.8 MCG/ACT AERO Inhale into the lungs.       tadalafil (CIALIS) 20 MG tablet Take 20 mg by mouth daily as needed.   terbinafine (LAMISIL) 250 MG tablet Take 1 tablet (250 mg  total) by mouth daily.   traZODone (DESYREL) 150 MG tablet Take by mouth at bedtime.   Past Medical History:  Diagnosis Date   Anxiety    Hx of adenomatous colonic polyps 01/04/2016   Hypertrophy of nasal turbinates    Sinus disease    Smoker    Past Surgical History:  Procedure Laterality Date   CARPAL TUNNEL RELEASE Left    ETHMOIDECTOMY Bilateral 12/05/2015   Procedure: BILATERAL TOTAL  ETHMOIDECTOMY, MAXILLARY OSTEA  ENLARGEMENT;  Surgeon: Rozetta Nunnery, MD;  Location: [place];  Service: ENT;  Laterality: Bilateral;   EYE SURGERY     cataract   HERNIA REPAIR     UHR   SINUS ENDO WITH FUSION Bilateral 12/05/2015   Procedure: FUCTIONAL  ENDOSCOPIC SINUS SURGERY WITH FUSION NAVIGATION;  Surgeon: Rozetta Nunnery,  MD;  Location: [place];  Service: ENT;  Laterality: Bilateral;   TURBINATE REDUCTION Bilateral 12/05/2015   Procedure: BILATERAL TURBINATE REDUCTION;  Surgeon: Rozetta Nunnery, MD;  Location: [place];  Service: ENT;  Laterality: Bilateral;   ULNAR NERVE REPAIR Left    decompression   Social History   Social History Narrative   Married, 1 grown child   still smokes a few cigarettes a day   Alcohol once a month no drug use   family history includes Colon polyps in his father; Ehlers-Danlos syndrome in his sister; Liver cancer in his mother.   Review of Systems As per HPI otherwise negative  Objective:   Physical Exam '@BP'$  104/60 (BP Location: Left Arm, Patient Position: Sitting, Cuff Size: Normal)   Pulse 80   Ht '5\' 6"'$  (1.676 m) Comment: height measured without shoes  Wt 170 lb 4 oz (77.2 kg)   BMI 27.48 kg/m @  General:  Well-developed, well-nourished and in no acute distress Eyes:  anicteric. ENT:   Mouth and posterior pharynx free of lesions.  Teeth are in fair repair at best Lungs: Clear to auscultation bilaterally with fair air movement reduced breath sounds throughout Heart:  S1S2, no rubs, murmurs, gallops. Abdomen:  soft, non-tender, no hepatosplenomegaly, hernia, or mass and BS+.  There is a small diastases recti Rectal: Deferred until colonoscopy Lymph:  no cervical or supraclavicular adenopathy. Extremities:   no edema, cyanosis or clubbing Skin   no rash. Neuro:  A&O x 3.  Psych:  appropriate mood and  Affect.   Data Reviewed: See HPI

## 2021-12-04 ENCOUNTER — Ambulatory Visit: Payer: Medicare HMO | Admitting: Podiatry

## 2021-12-04 DIAGNOSIS — Z79899 Other long term (current) drug therapy: Secondary | ICD-10-CM | POA: Diagnosis not present

## 2021-12-04 DIAGNOSIS — B351 Tinea unguium: Secondary | ICD-10-CM

## 2021-12-04 NOTE — Patient Instructions (Signed)
Terbinafine Tablets What is this medication? TERBINAFINE (TER bin a feen) treats fungal infections of the nails. It belongs to a group of medications called antifungals. It will not treat infections caused by bacteria or viruses. This medicine may be used for other purposes; ask your health care provider or pharmacist if you have questions. COMMON BRAND NAME(S): Lamisil, Terbinex What should I tell my care team before I take this medication? They need to know if you have any of these conditions: Liver disease An unusual or allergic reaction to terbinafine, other medications, foods, dyes, or preservatives Pregnant or trying to get pregnant Breast-feeding How should I use this medication? Take this medication by mouth with water. Take it as directed on the prescription label at the same time every day. You can take it with or without food. If it upsets your stomach, take it with food. Keep taking it unless your care team tells you to stop. A special MedGuide will be given to you by the pharmacist with each prescription and refill. Be sure to read this information carefully each time. Talk to your care team regarding the use of this medication in children. Special care may be needed. Overdosage: If you think you have taken too much of this medicine contact a poison control center or emergency room at once. NOTE: This medicine is only for you. Do not share this medicine with others. What if I miss a dose? If you miss a dose, take it as soon as you can unless it is more than 4 hours late. If it is more than 4 hours late, skip the missed dose. Take the next dose at the normal time. What may interact with this medication? Do not take this medication with any of the following: Pimozide Thioridazine This medication may also interact with the following: Beta blockers Caffeine Certain medications for mental health conditions Cimetidine Cyclosporine Medications for fungal infections like fluconazole  and ketoconazole Medications for irregular heartbeat like amiodarone, flecainide and propafenone Rifampin Warfarin This list may not describe all possible interactions. Give your health care provider a list of all the medicines, herbs, non-prescription drugs, or dietary supplements you use. Also tell them if you smoke, drink alcohol, or use illegal drugs. Some items may interact with your medicine. What should I watch for while using this medication? Visit your care team for regular checks on your progress. You may need blood work while you are taking this medication. It may be some time before you see the benefit from this medication. This medication may cause serious skin reactions. They can happen weeks to months after starting the medication. Contact your care team right away if you notice fevers or flu-like symptoms with a rash. The rash may be red or purple and then turn into blisters or peeling of the skin. Or, you might notice a red rash with swelling of the face, lips or lymph nodes in your neck or under your arms. This medication can make you more sensitive to the sun. Keep out of the sun, If you cannot avoid being in the sun, wear protective clothing and sunscreen. Do not use sun lamps or tanning beds/booths. What side effects may I notice from receiving this medication? Side effects that you should report to your care team as soon as possible: Allergic reactions--skin rash, itching, hives, swelling of the face, lips, tongue, or throat Change in sense of smell Change in taste Infection--fever, chills, cough, or sore throat Liver injury--right upper belly pain, loss of appetite, nausea,   light-colored stool, dark yellow or brown urine, yellowing skin or eyes, unusual weakness or fatigue Low red blood cell level--unusual weakness or fatigue, dizziness, headache, trouble breathing Lupus-like syndrome--joint pain, swelling, or stiffness, butterfly-shaped rash on the face, rashes that get worse  in the sun, fever, unusual weakness or fatigue Rash, fever, and swollen lymph nodes Redness, blistering, peeling, or loosening of the skin, including inside the mouth Unusual bruising or bleeding Worsening mood, feelings of depression Side effects that usually do not require medical attention (report to your care team if they continue or are bothersome): Diarrhea Gas Headache Nausea Stomach pain Upset stomach This list may not describe all possible side effects. Call your doctor for medical advice about side effects. You may report side effects to FDA at 1-800-FDA-1088. Where should I keep my medication? Keep out of the reach of children and pets. Store between 20 and 25 degrees C (68 and 77 degrees F). Protect from light. Get rid of any unused medication after the expiration date. To get rid of medications that are no longer needed or have expired: Take the medication to a medication take-back program. Check with your pharmacy or law enforcement to find a location. If you cannot return the medication, check the label or package insert to see if the medication should be thrown out in the garbage or flushed down the toilet. If you are not sure, ask your care team. If it is safe to put it in the trash, take the medication out of the container. Mix the medication with cat litter, dirt, coffee grounds, or other unwanted substance. Seal the mixture in a bag or container. Put it in the trash. NOTE: This sheet is a summary. It may not cover all possible information. If you have questions about this medicine, talk to your doctor, pharmacist, or health care provider.  2023 Elsevier/Gold Standard (2020-08-08 00:00:00)  

## 2021-12-04 NOTE — Progress Notes (Signed)
Subjective: Chief Complaint  Patient presents with   Nail Problem    Nail fungus, Patient denies any pain, Lamisil    66 year old male presents for above concerns.  No side affects. Thinks the nails are getting better. No pain to the nails. No swelling, redness, drainage.    Objective: AAO x3, NAD DP/PT pulses palpable bilaterally, CRT less than 3 seconds Note remains hypertrophic, dystrophic with yellow, brown discoloration.  There is some clearing along the nails mostly on proximal nail fold.  There is no edema, erythema, drainage or pus or signs of infection.  No open lesions. No pain with calf compression, swelling, warmth, erythema  Assessment: Onychomycosis, currently on Lamisil without side effects  Plan: -All treatment options discussed with the patient including all alternatives, risks, complications.  -Sharply debrided the nails x10 without any complications or bleeding. -Order to 30 more days of Lamisil but will check continue CBC, LFT prior to starting medication. -Patient encouraged to call the office with any questions, concerns, change in symptoms.   Trula Slade DPM

## 2021-12-07 ENCOUNTER — Other Ambulatory Visit: Payer: Self-pay | Admitting: Podiatry

## 2021-12-07 LAB — CBC WITH DIFFERENTIAL/PLATELET
Absolute Monocytes: 1435 cells/uL — ABNORMAL HIGH (ref 200–950)
Basophils Absolute: 45 cells/uL (ref 0–200)
Basophils Relative: 0.3 %
Eosinophils Absolute: 60 cells/uL (ref 15–500)
Eosinophils Relative: 0.4 %
HCT: 42.3 % (ref 38.5–50.0)
Hemoglobin: 14.5 g/dL (ref 13.2–17.1)
Lymphs Abs: 846 cells/uL — ABNORMAL LOW (ref 850–3900)
MCH: 31.7 pg (ref 27.0–33.0)
MCHC: 34.3 g/dL (ref 32.0–36.0)
MCV: 92.6 fL (ref 80.0–100.0)
MPV: 9.3 fL (ref 7.5–12.5)
Monocytes Relative: 9.5 %
Neutro Abs: 12714 cells/uL — ABNORMAL HIGH (ref 1500–7800)
Neutrophils Relative %: 84.2 %
Platelets: 314 10*3/uL (ref 140–400)
RBC: 4.57 10*6/uL (ref 4.20–5.80)
RDW: 13.9 % (ref 11.0–15.0)
Total Lymphocyte: 5.6 %
WBC: 15.1 10*3/uL — ABNORMAL HIGH (ref 3.8–10.8)

## 2021-12-07 LAB — HEPATIC FUNCTION PANEL
AG Ratio: 2 (calc) (ref 1.0–2.5)
ALT: 13 U/L (ref 9–46)
AST: 15 U/L (ref 10–35)
Albumin: 4.1 g/dL (ref 3.6–5.1)
Alkaline phosphatase (APISO): 68 U/L (ref 35–144)
Bilirubin, Direct: 0.1 mg/dL (ref 0.0–0.2)
Globulin: 2.1 g/dL (calc) (ref 1.9–3.7)
Indirect Bilirubin: 0.5 mg/dL (calc) (ref 0.2–1.2)
Total Bilirubin: 0.6 mg/dL (ref 0.2–1.2)
Total Protein: 6.2 g/dL (ref 6.1–8.1)

## 2021-12-07 MED ORDER — TERBINAFINE HCL 250 MG PO TABS: 250.0000 mg | ORAL_TABLET | Freq: Every day | ORAL | 0 refills | Status: DC

## 2021-12-07 NOTE — Progress Notes (Signed)
I called patient with results. I have sent Lamisil to the pharmacy. Also discussed his increased WBC and other elevated blood work. I will send to his PCP but also encouraged him to follow up with them.

## 2021-12-10 ENCOUNTER — Ambulatory Visit: Payer: Medicare HMO | Admitting: Pulmonary Disease

## 2021-12-10 ENCOUNTER — Ambulatory Visit: Payer: Medicare HMO | Admitting: Podiatry

## 2021-12-17 ENCOUNTER — Encounter: Payer: Self-pay | Admitting: Internal Medicine

## 2021-12-25 ENCOUNTER — Encounter: Payer: Self-pay | Admitting: Internal Medicine

## 2021-12-25 ENCOUNTER — Ambulatory Visit (AMBULATORY_SURGERY_CENTER): Payer: Medicare HMO | Admitting: Internal Medicine

## 2021-12-25 VITALS — BP 112/63 | HR 79 | Temp 98.2°F | Resp 16 | Ht 66.0 in | Wt 170.0 lb

## 2021-12-25 DIAGNOSIS — D123 Benign neoplasm of transverse colon: Secondary | ICD-10-CM

## 2021-12-25 DIAGNOSIS — K219 Gastro-esophageal reflux disease without esophagitis: Secondary | ICD-10-CM | POA: Diagnosis not present

## 2021-12-25 DIAGNOSIS — Z86010 Personal history of colonic polyps: Secondary | ICD-10-CM | POA: Diagnosis not present

## 2021-12-25 DIAGNOSIS — K625 Hemorrhage of anus and rectum: Secondary | ICD-10-CM

## 2021-12-25 DIAGNOSIS — R1319 Other dysphagia: Secondary | ICD-10-CM | POA: Diagnosis not present

## 2021-12-25 DIAGNOSIS — K449 Diaphragmatic hernia without obstruction or gangrene: Secondary | ICD-10-CM

## 2021-12-25 DIAGNOSIS — Z09 Encounter for follow-up examination after completed treatment for conditions other than malignant neoplasm: Secondary | ICD-10-CM

## 2021-12-25 MED ORDER — SODIUM CHLORIDE 0.9 % IV SOLN: 500.0000 mL | Freq: Once | INTRAVENOUS | Status: DC

## 2021-12-25 NOTE — Op Note (Signed)
[facility] Patient Name: Austin Glass Procedure Date: 12/25/2021 2:55 PM MRN: [id number] Endoscopist: Gatha Mayer , MD, [id number] Age: 66 Referring MD:  Date of Birth: [birth year] Gender: Male Account #: [id number] Procedure:                Colonoscopy Indications:              Surveillance: Personal history of adenomatous                            polyps on last colonoscopy > 5 years ago, Last                            colonoscopy: 2017 and rectal bleeding Medicines:                Monitored Anesthesia Care Procedure:                Pre-Anesthesia Assessment:                           - Prior to the procedure, a History and Physical                            was performed, and patient medications and                            allergies were reviewed. The patient's tolerance of                            previous anesthesia was also reviewed. The risks                            and benefits of the procedure and the sedation                            options and risks were discussed with the patient.                            All questions were answered, and informed consent                            was obtained. Prior Anticoagulants: The patient has                            taken no anticoagulant or antiplatelet agents. ASA                            Grade Assessment: II - A patient with mild systemic                            disease. After reviewing the risks and benefits,                            the patient was deemed in satisfactory condition to  undergo the procedure.                           After obtaining informed consent, the colonoscope                            was passed under direct vision. Throughout the                            procedure, the patient's blood pressure, pulse, and                            oxygen saturations were monitored continuously. The                            CF [id number] #[id number] was  introduced through the anus                            and advanced to the the cecum, identified by                            appendiceal orifice and ileocecal valve. The                            colonoscopy was performed without difficulty. The                            patient tolerated the procedure well. The quality                            of the bowel preparation was good. The bowel                            preparation used was Miralax via split dose                            instruction. The ileocecal valve, appendiceal                            orifice, and rectum were photographed. Scope In: 3:06:25 PM Scope Out: 3:29:12 PM Scope Withdrawal Time: 0 hours 20 minutes 29 seconds  Total Procedure Duration: 0 hours 22 minutes 47 seconds  Findings:                 The perianal and digital rectal examinations were                            normal.                           Seven sessile polyps were found in the transverse                            colon. The polyps were 2 to 12 mm in size. These  polyps were removed with a cold snare. Resection                            and retrieval were complete. Verification of                            patient identification for the specimen was done.                            Estimated blood loss was minimal.                           Multiple diverticula were found in the entire colon.                           External and internal hemorrhoids were found.                           The exam was otherwise without abnormality on                            direct and retroflexion views. Complications:            No immediate complications. Estimated Blood Loss:     Estimated blood loss was minimal. Impression:               - Seven 2 to 12 mm polyps in the transverse colon,                            removed with a cold snare. Resected and retrieved.                           - Diverticulosis in the entire  examined colon.                           - External and internal hemorrhoids. - cause of                            rectal bleeding                           - The examination was otherwise normal on direct                            and retroflexion views.                           - Personal history of colonic polyps. 4 adenomas                            2017 Recommendation:           - Patient has a contact number available for                            emergencies. The signs and symptoms of  potential                            delayed complications were discussed with the                            patient. Return to normal activities tomorrow.                            Written discharge instructions were provided to the                            patient.                           - Resume previous diet.                           - Continue present medications.                           - Repeat colonoscopy is recommended for                            surveillance. The colonoscopy date will be                            determined after pathology results from today's                            exam become available for review. Gatha Mayer, MD 12/25/2021 3:42:25 PM This report has been signed electronically.

## 2021-12-25 NOTE — Progress Notes (Signed)
History and Physical Interval Note:  12/25/2021 2:51 PM  Nell Range  has presented today for endoscopic procedure(s), with the diagnosis of  Encounter Diagnoses  Name Primary?   Rectal bleeding Yes   Gastroesophageal reflux disease, unspecified whether esophagitis present    Esophageal dysphagia    Hx of adenomatous colonic polyps   .  The various methods of evaluation and treatment have been discussed with the patient and/or family. After consideration of risks, benefits and other options for treatment, the patient has consented to  the endoscopic procedure(s).   The patient's history has been reviewed, patient examined, no change in status, stable for endoscopic procedure(s).  I have reviewed the patient's chart and labs.  Questions were answered to the patient's satisfaction.     Gatha Mayer, MD, [facility]

## 2021-12-25 NOTE — Op Note (Signed)
[place] Patient Name: Austin Glass Procedure Date: 12/25/2021 2:56 PM MRN: [id number] Endoscopist: Gatha Mayer , MD, [id number] Age: 66 Referring MD:  Date of Birth: Feb 13, 1955 Gender: Male Account #: [id number] Procedure:                Upper GI endoscopy Indications:              Dysphagia, Suspected esophageal reflux Medicines:                Monitored Anesthesia Care Procedure:                Pre-Anesthesia Assessment:                           - Prior to the procedure, a History and Physical                            was performed, and patient medications and                            allergies were reviewed. The patient's tolerance of                            previous anesthesia was also reviewed. The risks                            and benefits of the procedure and the sedation                            options and risks were discussed with the patient.                            All questions were answered, and informed consent                            was obtained. Prior Anticoagulants: The patient has                            taken no anticoagulant or antiplatelet agents. ASA                            Grade Assessment: II - A patient with mild systemic                            disease. After reviewing the risks and benefits,                            the patient was deemed in satisfactory condition to                            undergo the procedure.                           After obtaining informed consent, the endoscope was  passed under direct vision. Throughout the                            procedure, the patient's blood pressure, pulse, and                            oxygen saturations were monitored continuously. The                            Endoscope was introduced through the mouth, and                            advanced to the second part of duodenum. The upper                            GI endoscopy was  accomplished without difficulty.                            The patient tolerated the procedure well. Scope In: Scope Out: Findings:                 One benign-appearing, intrinsic mild                            (non-circumferential scarring) stenosis was found                            at the gastroesophageal junction. The stenosis was                            traversed. A TTS dilator was passed through the                            scope. Dilation with an 18-19-20 mm balloon dilator                            was performed to 20 mm. The dilation site was                            examined and showed no change. Estimated blood                            loss: none.                           A 5 cm hiatal hernia was present.                           The gastroesophageal flap valve was visualized                            endoscopically and classified as Hill Grade IV (no                            fold, wide open  lumen, hiatal hernia present).                           The exam was otherwise without abnormality.                           The cardia and gastric fundus were normal on                            retroflexion. Complications:            No immediate complications. Estimated Blood Loss:     Estimated blood loss: none. Impression:               - Benign-appearing esophageal stenosis. Dilated.                           - 5 cm hiatal hernia.                           - Gastroesophageal flap valve classified as Hill                            Grade IV (no fold, wide open lumen, hiatal hernia                            present).                           - The examination was otherwise normal.                           - No specimens collected. Recommendation:           - Patient has a contact number available for                            emergencies. The signs and symptoms of potential                            delayed complications were discussed with the                             patient. Return to normal activities tomorrow.                            Written discharge instructions were provided to the                            patient.                           - Resume previous diet.                           - Continue present medications.                           -  See the other procedure note for documentation of                            additional recommendations. Gatha Mayer, MD 12/25/2021 3:37:04 PM This report has been signed electronically.

## 2021-12-25 NOTE — Progress Notes (Signed)
Report to PACU, RN, vss, BBS= Clear.  

## 2021-12-25 NOTE — Patient Instructions (Addendum)
I dilated the esophagus and saw a hiatal hernia. Stay on omeprazole.  I found and removed 7 colon polyps small to medium. All look benign, but pre-cancerous. I will let you know pathology results and when to have another routine colonoscopy by mail and/or My Chart.  You also have a condition called diverticulosis - common and not usually a problem. Please read the handout provided. Hemorrhoids also seen - common and not usually a problem also.  Please resume normal diet and medications.  I appreciate the opportunity to care for you. Gatha Mayer, MD, [facility]  Information on polyps, hemorrhoids, diverticulosis, hiatal hernia given to you today.  Resume previous diet and medications.  Await pathology results..   YOU HAD AN ENDOSCOPIC PROCEDURE TODAY AT [facility]:   Refer to the procedure report that was given to you for any specific questions about what was found during the examination.  If the procedure report does not answer your questions, please call your gastroenterologist to clarify.  If you requested that your care partner not be given the details of your procedure findings, then the procedure report has been included in a sealed envelope for you to review at your convenience later.  YOU SHOULD EXPECT: Some feelings of bloating in the abdomen. Passage of more gas than usual.  Walking can help get rid of the air that was put into your GI tract during the procedure and reduce the bloating. If you had a lower endoscopy (such as a colonoscopy or flexible sigmoidoscopy) you may notice spotting of blood in your stool or on the toilet paper. If you underwent a bowel prep for your procedure, you may not have a normal bowel movement for a few days.  Please Note:  You might notice some irritation and congestion in your nose or some drainage.  This is from the oxygen used during your procedure.  There is no need for concern and it should clear up in a day or so.  SYMPTOMS TO  REPORT IMMEDIATELY:  Following lower endoscopy (colonoscopy or flexible sigmoidoscopy):  Excessive amounts of blood in the stool  Significant tenderness or worsening of abdominal pains  Swelling of the abdomen that is new, acute  Fever of 100F or higher  Following upper endoscopy (EGD)  Vomiting of blood or coffee ground material  New chest pain or pain under the shoulder blades  Painful or persistently difficult swallowing  New shortness of breath  Fever of 100F or higher  Black, tarry-looking stools  For urgent or emergent issues, a gastroenterologist can be reached at any hour by calling [phone]. Do not use MyChart messaging for urgent concerns.    DIET:  We do recommend a small meal at first, but then you may proceed to your regular diet.  Drink plenty of fluids but you should avoid alcoholic beverages for 24 hours.  ACTIVITY:  You should plan to take it easy for the rest of today and you should NOT DRIVE or use heavy machinery until tomorrow (because of the sedation medicines used during the test).    FOLLOW UP: Our staff will call the number listed on your records the next business day following your procedure.  We will call around 7:15- 8:00 am to check on you and address any questions or concerns that you may have regarding the information given to you following your procedure. If we do not reach you, we will leave a message.     If any biopsies were taken  you will be contacted by phone or by letter within the next 1-3 weeks.  Please call us at [phone] if you have not heard about the biopsies in 3 weeks.    SIGNATURES/CONFIDENTIALITY: You and/or your care partner have signed paperwork which will be entered into your electronic medical record.  These signatures attest to the fact that that the information above on your After Visit Summary has been reviewed and is understood.  Full responsibility of the confidentiality of this discharge information lies with you  and/or your care-partner.

## 2021-12-25 NOTE — Progress Notes (Signed)
VS completed by DT.  Pt's states no medical or surgical changes since previsit or office visit.  

## 2021-12-25 NOTE — Progress Notes (Signed)
Called to room to assist during endoscopic procedure.  Patient ID and intended procedure confirmed with present staff. Received instructions for my participation in the procedure from the performing physician.  

## 2021-12-26 ENCOUNTER — Telehealth: Payer: Self-pay

## 2021-12-26 NOTE — Telephone Encounter (Signed)
  Follow up Call-     12/25/2021    2:04 PM  Call back number  Post procedure Call Back phone  # [phone]  Permission to leave phone message Yes     Patient questions:  Do you have a fever, pain , or abdominal swelling? No. Pain Score  0 *  Have you tolerated food without any problems? Yes.    Have you been able to return to your normal activities? Yes.    Do you have any questions about your discharge instructions: Diet   No. Medications  No. Follow up visit  No.  Do you have questions or concerns about your Care? No.  Actions: * If pain score is 4 or above: No action needed, pain <4.

## 2021-12-31 ENCOUNTER — Encounter: Payer: Self-pay | Admitting: Internal Medicine

## 2021-12-31 ENCOUNTER — Encounter: Payer: Self-pay | Admitting: Pulmonary Disease

## 2021-12-31 ENCOUNTER — Ambulatory Visit: Payer: Medicare HMO | Admitting: Pulmonary Disease

## 2021-12-31 VITALS — BP 118/70 | HR 64 | Temp 98.0°F | Ht 69.0 in | Wt 172.8 lb

## 2021-12-31 DIAGNOSIS — J4489 Other specified chronic obstructive pulmonary disease: Secondary | ICD-10-CM | POA: Insufficient documentation

## 2021-12-31 DIAGNOSIS — F17210 Nicotine dependence, cigarettes, uncomplicated: Secondary | ICD-10-CM | POA: Diagnosis not present

## 2021-12-31 DIAGNOSIS — Z86010 Personal history of colonic polyps: Secondary | ICD-10-CM

## 2021-12-31 DIAGNOSIS — J449 Chronic obstructive pulmonary disease, unspecified: Secondary | ICD-10-CM

## 2021-12-31 DIAGNOSIS — J439 Emphysema, unspecified: Secondary | ICD-10-CM | POA: Diagnosis not present

## 2021-12-31 DIAGNOSIS — F17200 Nicotine dependence, unspecified, uncomplicated: Secondary | ICD-10-CM

## 2021-12-31 HISTORY — DX: Emphysema, unspecified: J43.9

## 2021-12-31 HISTORY — DX: Other specified chronic obstructive pulmonary disease: J44.89

## 2021-12-31 NOTE — Progress Notes (Signed)
Austin Glass    [id number]    1955-06-12  Primary Care Physician:Elkins, Curt Jews, MD  Referring Physician: Leonard Downing, MD [address] [place],  [place] [address]  Chief complaint: Follow up for dyspnea  HPI: 65 y.o.  smoker with history of asthma He had an episode of pneumonia in November from a chest x-ray at primary care.  I do not have those images to review.  Repeat chest x-ray 12/29/2020 showed bronchitis changes.  He was treated with doxycycline and prednisone and started on breztri.  Referred here for further evaluation  Overall he is improved but continues to have mild cough on exertion with chest congestion.  Pets: No pets Occupation: Cabin crew Exposures: Has down pillows.  Note has a damp basement and possible mold.  No hot tubs, Jacuzzis  Smoking history: Half to 1 pack/day for 20 years.  Quit smoking in July 2022 Travel history: No significant travel history Relevant family history: Dad died of emphysema.  He was a smoker.  Interim history: He is here for review of PFTs Using the breztri only intermittently. Continues to smoke.   Outpatient Encounter Medications as of 12/31/2021  Medication Sig   albuterol (VENTOLIN HFA) 108 (90 Base) MCG/ACT inhaler Inhale 2 puffs into the lungs every 6 (six) hours as needed for wheezing or shortness of breath.   BREZTRI AEROSPHERE 160-9-4.8 MCG/ACT AERO Inhale into the lungs.   omeprazole (PRILOSEC) 40 MG capsule Take 1 capsule (40 mg total) by mouth daily before breakfast.   tadalafil (CIALIS) 20 MG tablet Take 20 mg by mouth daily as needed.   terbinafine (LAMISIL) 250 MG tablet Take 1 tablet (250 mg total) by mouth daily.   traZODone (DESYREL) 150 MG tablet Take by mouth at bedtime.   No facility-administered encounter medications on file as of 12/31/2021.    Physical Exam: Blood pressure 118/70, pulse 64, temperature 98 F (36.7 C), temperature source Oral, height '5\' 9"'$  (1.753 m), weight  172 lb 12.8 oz (78.4 kg), SpO2 94 %. Gen:      No acute distress HEENT:  EOMI, sclera anicteric Neck:     No masses; no thyromegaly Lungs:    Clear to auscultation bilaterally; normal respiratory effort CV:         Regular rate and rhythm; no murmurs Abd:      + bowel sounds; soft, non-tender; no palpable masses, no distension Ext:    No edema; adequate peripheral perfusion Skin:      Warm and dry; no rash Neuro: alert and oriented x 3 Psych: normal mood and affect   Data Reviewed: Imaging: Chest x-ray 12/28/2020-coarse perihilar bronchovascular markings.  I have reviewed the images personally.  PFTs: 11/21/2021 FVC 2.59 [64%], FEV1 0.98 [32%], F/F38, TLC 8.41 (131%], DLCO 13.12 [54%] Severe sleep PD with hyperinflation, air trapping Moderate diffusion defect  Labs: Labs from primary care CBC 12/28/2020-WBC 8.1, eos 0% Metabolic panel-within normal limits  Assessment:  Severe COPD We reviewed PFTs today which shows severe COPD Also has history of asthma though CBC recently did not show peripheral eosinophils.  Exposures notable for down pillows and possible mold I have asked him to be more compliant with breztri and use 2 puffs twice daily  Active smoker We discussed smoking cessation in detail.  He is not interested in quitting.  He continues to use nicotine patches and does not want to try Chantix Reassess at return visit.  Time spent counseling-5  minutes  Refer to note of screening CT chest  Plan/Recommendations: Continue Breztri, smoking cessation Low-dose screening CT chest  Marshell Garfinkel MD [facility] 12/31/2021, 9:21 AM  CC: Leonard Downing, *

## 2021-12-31 NOTE — Patient Instructions (Signed)
I had reviewed her PFTs which show severe COPD Continue the Breztri inhaler.  You need to take 2 puffs in the morning and 2 puffs in the evening Continue to work on smoking cessation with nicotine patches Will refer you for low-dose screening CT of the chest Follow-up in 6 months.

## 2022-02-21 ENCOUNTER — Ambulatory Visit: Payer: Medicare HMO | Admitting: Podiatry

## 2022-02-21 DIAGNOSIS — B351 Tinea unguium: Secondary | ICD-10-CM | POA: Diagnosis not present

## 2022-02-21 NOTE — Progress Notes (Signed)
Subjective: Chief Complaint  Patient presents with   Foot Problem    Bilateral foot fungus, 74 years,     67 year old male presents for above concerns.  No side affects.  He has no swelling redness or any drainage of the toenail sites he has no pain.   Objective: AAO x3, NAD DP/PT pulses palpable bilaterally, CRT less than 3 seconds Note remains hypertrophic, dystrophic with yellow, brown discoloration.  There is also clear on the proximal nail fold and appears to be growing out however the remainder of the nail still is hypertrophic, dystrophic discolored.  There is no edema, erythema or signs of infection.  No open lesions.  No pain with calf compression, swelling, warmth, erythema  Assessment: Onychomycosis, currently on Lamisil without side effects  Plan: -All treatment options discussed with the patient including all alternatives, risks, complications.  -Sharply debrided the nails x10 without any complications or bleeding as a courtesy open not have any pain.  I will order a compound cream through [facility] for onychomycosis -Patient encouraged to call the office with any questions, concerns, change in symptoms.   Trula Slade DPM

## 2022-02-21 NOTE — Patient Instructions (Signed)
I have ordered a medication for you that will come from [facility] in [place]. They should be calling you to verify insurance and will mail the medication to you. If you live close by then you can go by their pharmacy to pick up the medication. Their phone number is [phone]. If you do not hear from them in the next few days, please give us a call at [phone].   

## 2022-03-04 ENCOUNTER — Telehealth: Payer: Self-pay | Admitting: *Deleted

## 2022-03-04 NOTE — Telephone Encounter (Signed)
Wife of patient is calling for the status of a prescription that was supposed to be sent to pharmacy, not there, epic notes mentioned that the medication was being sent. [place], did not receive, please advise/resend

## 2022-03-06 NOTE — Telephone Encounter (Signed)
Patient has been updated.

## 2022-07-01 ENCOUNTER — Ambulatory Visit: Payer: Medicare HMO | Admitting: Podiatry

## 2022-07-01 DIAGNOSIS — B351 Tinea unguium: Secondary | ICD-10-CM

## 2022-07-01 NOTE — Progress Notes (Unsigned)
Subjective: Chief Complaint  Patient presents with   Nail Problem    Nail trim     67 year old male presents for above concerns.  Previously was on Lamisil and is also been using topical antifungal.  He has no swelling redness or any drainage of the toenail sites he has no pain.   Objective: AAO x3, NAD DP/PT pulses palpable bilaterally, CRT less than 3 seconds Toenails remains hypertrophic, dystrophic with yellow, brown discoloration.  There is also clear on the proximal nail fold and appears to be growing out however the remainder of the nail still is hypertrophic, dystrophic discolored.  Mild improvement there is no edema, erythema or signs of infection.  No open lesions.  No pain with calf compression, swelling, warmth, erythema  Assessment: Onychomycosis  Plan: -All treatment options discussed with the patient including all alternatives, risks, complications.  -Sharply debrided the nails x10 without any complications or bleeding as a courtesy open not have any pain.  Continue medication for nail fungus. -Patient encouraged to call the office with any questions, concerns, change in symptoms.   Vivi Barrack DPM

## 2022-07-18 ENCOUNTER — Other Ambulatory Visit: Payer: Self-pay | Admitting: Family Medicine

## 2022-07-18 DIAGNOSIS — I272 Pulmonary hypertension, unspecified: Secondary | ICD-10-CM

## 2022-07-18 DIAGNOSIS — R609 Edema, unspecified: Secondary | ICD-10-CM

## 2022-08-16 ENCOUNTER — Ambulatory Visit: Payer: Medicare HMO

## 2022-08-16 DIAGNOSIS — R609 Edema, unspecified: Secondary | ICD-10-CM

## 2022-08-16 DIAGNOSIS — I272 Pulmonary hypertension, unspecified: Secondary | ICD-10-CM

## 2022-08-17 IMAGING — CR DG CHEST 2V
2 series · 2 of 2 positions shown · non-contrast
Comparison: 07/27/2014

CLINICAL DATA: SOB, cough, weakness and recent pneumonia x 5 weeks.
Chest pain today. Hx of asthma. Quit smoking 6 months ago

EXAM:
CHEST - 2 VIEW

[w chest pa]
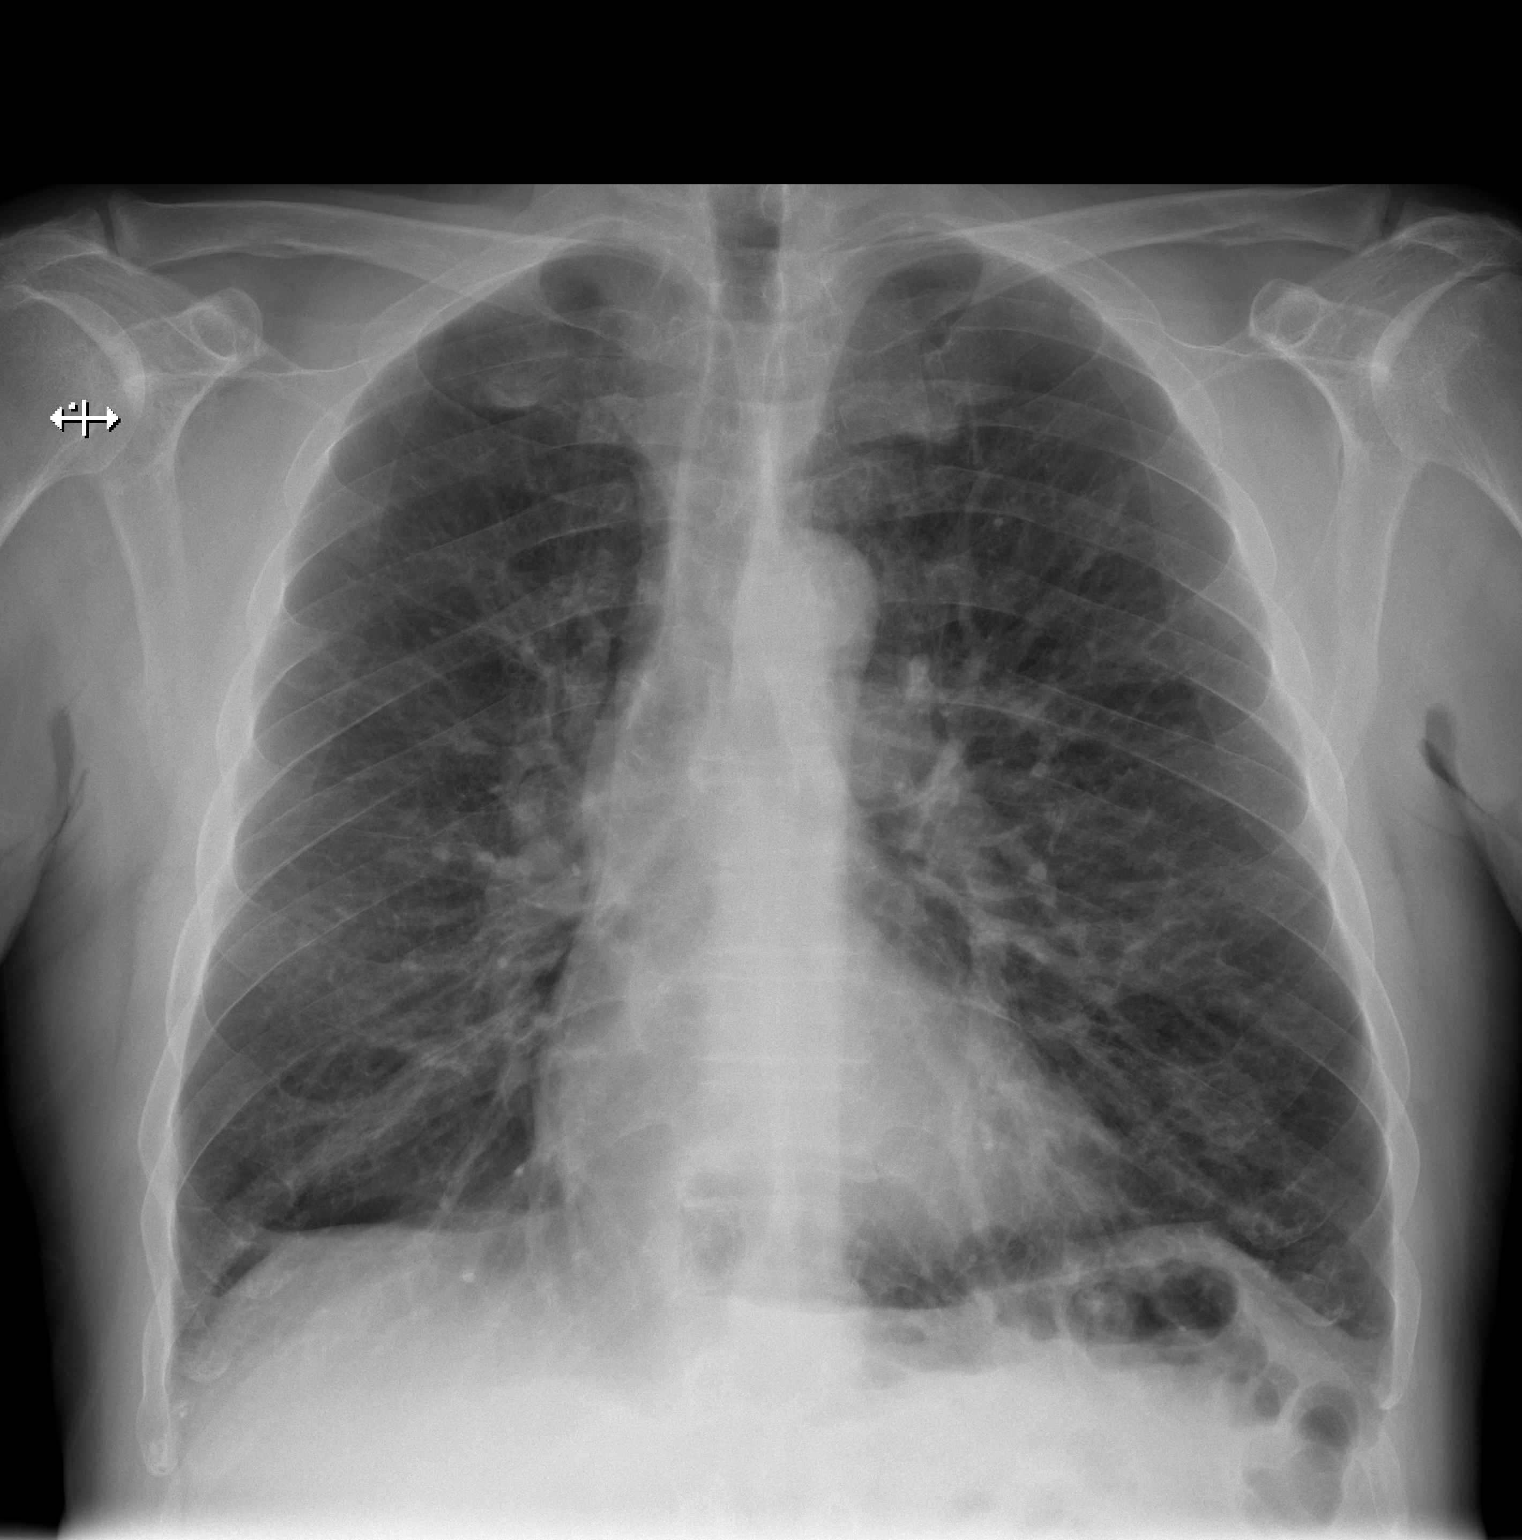

[w chest lat]
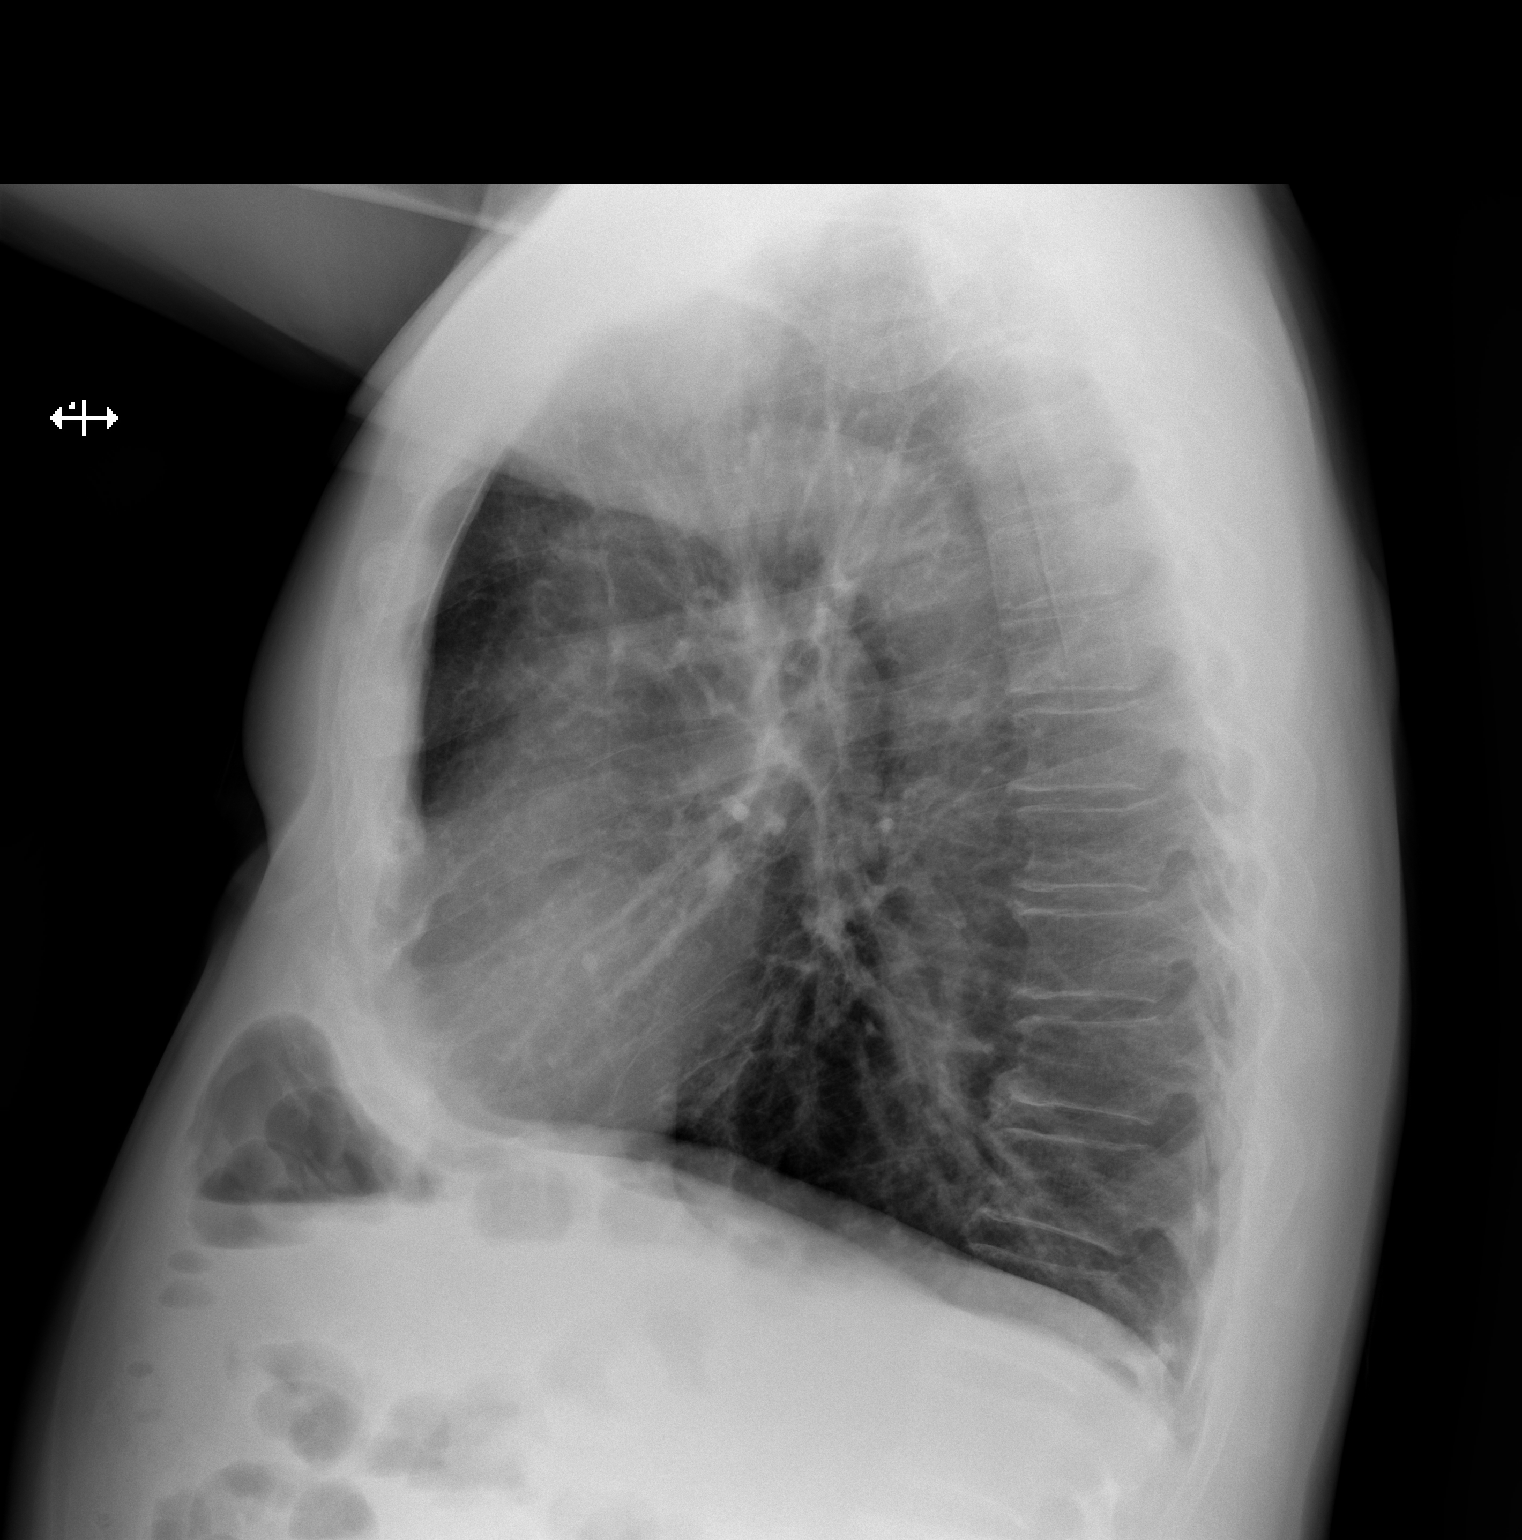

[2 of 2 positions shown; findings below may reference images not displayed]

FINDINGS: Prominent perihilar bronchovascular markings. No confluent airspace
infiltrate or peripheral edema. Mild pulmonary hyperinflation.

Heart size and mediastinal contours are within normal limits.

No effusion.

Visualized bones unremarkable.
IMPRESSION: Coarse perihilar bronchovascular markings without acute or
superimposed abnormality.

## 2022-09-19 ENCOUNTER — Telehealth: Payer: Medicare HMO | Admitting: Pulmonary Disease

## 2022-10-21 ENCOUNTER — Ambulatory Visit: Payer: Medicare HMO | Admitting: Pulmonary Disease

## 2022-10-21 ENCOUNTER — Encounter: Payer: Self-pay | Admitting: Pulmonary Disease

## 2022-10-21 VITALS — BP 122/60 | HR 65 | Ht 69.0 in | Wt 170.6 lb

## 2022-10-21 DIAGNOSIS — J439 Emphysema, unspecified: Secondary | ICD-10-CM

## 2022-10-21 DIAGNOSIS — F17200 Nicotine dependence, unspecified, uncomplicated: Secondary | ICD-10-CM

## 2022-10-21 DIAGNOSIS — J4489 Other specified chronic obstructive pulmonary disease: Secondary | ICD-10-CM

## 2022-10-21 DIAGNOSIS — F17210 Nicotine dependence, cigarettes, uncomplicated: Secondary | ICD-10-CM

## 2022-10-21 MED ORDER — VARENICLINE TARTRATE (STARTER) 0.5 MG X 11 & 1 MG X 42 PO TBPK: 1.0000 | ORAL_TABLET | Freq: Every day | ORAL | 0 refills | Status: DC

## 2022-10-21 MED ORDER — NICOTINE 14 MG/24HR TD PT24: 14.0000 mg | MEDICATED_PATCH | Freq: Every day | TRANSDERMAL | 0 refills | Status: DC

## 2022-10-21 NOTE — Progress Notes (Signed)
Austin Glass    [id number]    09/24/1955  Primary Care Physician:Elkins, Curly Rim, MD  Referring Physician: Kaleen Mask, MD [address] [place],  [place] [address]  Chief complaint: Follow up for dyspnea  HPI: 67 y.o.  smoker with history of asthma He had an episode of pneumonia in November from a chest x-ray at primary care.  I do not have those images to review.  Repeat chest x-ray 12/29/2020 showed bronchitis changes.  He was treated with doxycycline and prednisone and started on breztri.  Referred here for further evaluation  Overall he is improved but continues to have mild cough on exertion with chest congestion.  Pets: No pets Occupation: Journalist, newspaper Exposures: Has down pillows.  Note has a damp basement and possible mold.  No hot tubs, Jacuzzis  Smoking history: Half to 1 pack/day for 20 years.  Quit smoking in July 2022 Travel history: No significant travel history Relevant family history: Dad died of emphysema.  He was a smoker.  Interim history: Discussed the use of AI scribe software for clinical note transcription with the patient, who gave verbal consent to proceed.  The patient, with a history of smoking, presents after a recent urgent care visit for a 'bad chest cold' that was diagnosed as pneumonia. The pneumonia was initially treated with Cefdinir, but due to persistent symptoms, he was subsequently treated with a Z-Pak by his primary care doctor. He reports feeling 'fine' and 'back to normal' since completing the Z-Pak 'a couple weeks ago.'  The patient also has a history of respiratory distress requiring daily breztri and PRN Albuterol. He reports using the Albuterol 'whenever I need to really,' such as when 'working, straining, doing stuff. During the pneumonia, he used the breztri multiple times daily due to feeling 'so bad.'  The patient continues to smoke 'maybe four cigarettes a day.' He expresses interest in smoking cessation  and is considering Chantix and nicotine patches.      Outpatient Encounter Medications as of 10/21/2022  Medication Sig   albuterol (VENTOLIN HFA) 108 (90 Base) MCG/ACT inhaler Inhale 2 puffs into the lungs every 6 (six) hours as needed for wheezing or shortness of breath.   BREZTRI AEROSPHERE 160-9-4.8 MCG/ACT AERO Inhale into the lungs.   omeprazole (PRILOSEC) 40 MG capsule Take 1 capsule (40 mg total) by mouth daily before breakfast.   tadalafil (CIALIS) 20 MG tablet Take 20 mg by mouth daily as needed.   traZODone (DESYREL) 150 MG tablet Take by mouth at bedtime.   [DISCONTINUED] terbinafine (LAMISIL) 250 MG tablet Take 1 tablet (250 mg total) by mouth daily.   No facility-administered encounter medications on file as of 10/21/2022.    Physical Exam: Blood pressure 122/60, pulse 65, height 5\' 9"  (1.753 m), weight 170 lb 9.6 oz (77.4 kg), SpO2 94%. Gen:      No acute distress HEENT:  EOMI, sclera anicteric Neck:     No masses; no thyromegaly Lungs:    Clear to auscultation bilaterally; normal respiratory effort CV:         Regular rate and rhythm; no murmurs Abd:      + bowel sounds; soft, non-tender; no palpable masses, no distension Ext:    No edema; adequate peripheral perfusion Skin:      Warm and dry; no rash Neuro: alert and oriented x 3 Psych: normal mood and affect   Data Reviewed: Imaging: Chest x-ray 12/28/2020-coarse perihilar bronchovascular markings.  I have reviewed the images personally.  PFTs: 11/21/2021 FVC 2.59 [64%], FEV1 0.98 [32%], F/F38, TLC 8.41 (131%], DLCO 13.12 [54%] Severe sleep PD with hyperinflation, air trapping Moderate diffusion defect  Labs: Labs from primary care CBC 12/28/2020-WBC 8.1, eos 0% Metabolic panel-within normal limits  Assessment:  Severe COPD Also has history of asthma though CBC recently did not show peripheral eosinophils.  Exposures notable for down pillows and possible mold   Active smoker We discussed smoking cessation  in detail.  Today he is interested in quitting smoking and has agreed to try nicotine patches and Chantix Reassess at return visit.  Time spent counseling-5 minutes  Referral for screening CT of the chest  Plan/Recommendations: Continue Breztri, smoking cessation Low-dose screening CT chest  Chilton Greathouse MD [facility] 10/21/2022, 11:16 AM  CC: Kaleen Mask, *    `

## 2022-10-21 NOTE — Patient Instructions (Signed)
VISIT SUMMARY:  During your recent visit, we discussed your recent bout of pneumonia, your ongoing management of Chronic Obstructive Pulmonary Disease (COPD), your current smoking habits, and the importance of lung cancer screening due to your heavy smoking history. You are feeling better after your pneumonia treatment and there is no need for further treatment at this time. Your COPD is being managed with Breo Ellipta and Albuterol, which you should continue using as prescribed. We also discussed your interest in quitting smoking and have decided to prescribe Chantix and nicotine patches to assist with this. Finally, due to your heavy smoking history, we discussed the benefits of an annual low-dose CT scan for lung cancer screening.  YOUR PLAN:  -PNEUMONIA: You recently had pneumonia, which is an infection in your lungs. You have completed your treatment and are feeling better. No further treatment is required at this time.  -CHRONIC OBSTRUCTIVE PULMONARY DISEASE (COPD): COPD is a chronic lung disease that makes it hard to breathe. You should continue your current inhaler regimen with Breo Ellipta and Albuterol as needed.  -TOBACCO USE: You currently smoke cigarettes, which can harm your health. We have prescribed Chantix and nicotine patches to help you quit smoking. Please set a quit date and start the medications one week prior.  -LUNG CANCER SCREENING: Due to your heavy smoking history, it's important to screen for lung cancer with an annual low-dose CT scan. This can help detect any potential issues early.  INSTRUCTIONS:  You will be referred for an annual low-dose CT scan for lung cancer screening. If you do not hear from the coordinator within a month, please notify our office. Also, remember to set a quit date for smoking and start your prescribed medications one week prior to that date.

## 2022-11-14 ENCOUNTER — Ambulatory Visit: Payer: Medicare HMO | Admitting: Podiatry

## 2022-11-14 ENCOUNTER — Encounter: Payer: Self-pay | Admitting: Podiatry

## 2022-11-14 VITALS — BP 148/61 | HR 62

## 2022-11-14 DIAGNOSIS — B351 Tinea unguium: Secondary | ICD-10-CM | POA: Diagnosis not present

## 2022-11-14 NOTE — Patient Instructions (Signed)
You can start a biotin supplement or a vitamin for "hair, skin and nails".

## 2022-11-14 NOTE — Progress Notes (Addendum)
Subjective: Chief Complaint  Patient presents with   Nail Problem    "Trim them and see if they're better than last time."     67 year old male presents for above concerns.  Previously was on Lamisil and is also been using topical antifungal.  He was using topical through [employer].  This limits any drainage.  No open lesions.   Objective: AAO x3, NAD DP/PT pulses palpable bilaterally, CRT less than 3 seconds Toenails remains hypertrophic, dystrophic with yellow, brown discoloration.  There is also clear on the proximal nail fold and appears to be growing out however the remainder of the nail still is hypertrophic, dystrophic discolored.  There does appear to be some improvement compared to last appointment.  Mild improvement there is no edema, erythema or signs of infection.  No open lesions.  No pain with calf compression, swelling, warmth, erythema  Assessment: Onychomycosis  Plan: -All treatment options discussed with the patient including all alternatives, risks, complications.  -Sharply debrided the nails x10 without any complications or bleeding as a courtesy open not have any pain.  Continue medication for nail fungus.  I am going to reorder the compound cream to [facility]. -Patient encouraged to call the office with any questions, concerns, change in symptoms.   Vivi Barrack DPM

## 2022-12-12 ENCOUNTER — Other Ambulatory Visit: Payer: Self-pay

## 2022-12-12 MED ORDER — OMEPRAZOLE 40 MG PO CPDR: 40.0000 mg | DELAYED_RELEASE_CAPSULE | Freq: Every day | ORAL | 3 refills | Status: DC

## 2022-12-12 NOTE — Telephone Encounter (Signed)
Omeprazole refilled as pharmacy requested. Up to date on office visits.

## 2023-02-10 ENCOUNTER — Other Ambulatory Visit (HOSPITAL_COMMUNITY): Payer: Self-pay

## 2023-02-12 ENCOUNTER — Other Ambulatory Visit (HOSPITAL_COMMUNITY): Payer: Self-pay

## 2023-02-13 ENCOUNTER — Other Ambulatory Visit (HOSPITAL_COMMUNITY): Payer: Self-pay

## 2023-02-13 MED ORDER — BREZTRI AEROSPHERE 160-9-4.8 MCG/ACT IN AERO
2.0000 | INHALATION_SPRAY | Freq: Two times a day (BID) | RESPIRATORY_TRACT | 3 refills | Status: DC
  Filled 2023-02-13 – 2023-02-15 (×2): qty 32.1, 90d supply, fill #0

## 2023-02-13 MED ORDER — TRAZODONE HCL 150 MG PO TABS
75.0000 mg | ORAL_TABLET | Freq: Every day | ORAL | 3 refills | Status: DC
  Filled 2023-02-13: qty 90, 90d supply, fill #0
  Filled 2023-05-10: qty 90, 90d supply, fill #1
  Filled 2023-08-27: qty 90, 90d supply, fill #2

## 2023-02-14 ENCOUNTER — Encounter: Payer: Self-pay | Admitting: Pharmacist

## 2023-02-14 ENCOUNTER — Other Ambulatory Visit: Payer: Self-pay

## 2023-02-14 ENCOUNTER — Ambulatory Visit: Payer: Medicare HMO | Admitting: Podiatry

## 2023-02-15 ENCOUNTER — Other Ambulatory Visit (HOSPITAL_COMMUNITY): Payer: Self-pay

## 2023-02-17 ENCOUNTER — Other Ambulatory Visit: Payer: Self-pay

## 2023-02-21 ENCOUNTER — Ambulatory Visit (INDEPENDENT_AMBULATORY_CARE_PROVIDER_SITE_OTHER): Payer: PPO | Admitting: Podiatry

## 2023-02-21 ENCOUNTER — Encounter: Payer: Self-pay | Admitting: Podiatry

## 2023-02-21 DIAGNOSIS — M79674 Pain in right toe(s): Secondary | ICD-10-CM

## 2023-02-21 DIAGNOSIS — M79675 Pain in left toe(s): Secondary | ICD-10-CM | POA: Diagnosis not present

## 2023-02-21 DIAGNOSIS — B351 Tinea unguium: Secondary | ICD-10-CM | POA: Diagnosis not present

## 2023-02-21 NOTE — Patient Instructions (Signed)
You can use UREA NAIL GEL on the toenails

## 2023-02-21 NOTE — Progress Notes (Unsigned)
Subjective: Chief Complaint  Patient presents with   RFC    RM#13 RFC patient has no concerns at this time.    68 year old male presents for above concerns.  Previously was on Lamisil and is also been using topical antifungal from [place].  Nails are thick and he has difficulty trimming them himself.  He does not feel comfortable going to get a pedicure.  Other nails become elongated to get tender.  Objective: AAO x3, NAD DP/PT pulses palpable bilaterally, CRT less than 3 seconds Toenails remains hypertrophic, dystrophic with yellow, brown discoloration.  About debrided with a having not seen much improvement.  He has tenderness nails 1-5 bilaterally. No pain with calf compression, swelling, warmth, erythema  Assessment: Symptomatic onychomycosis  Plan: -All treatment options discussed with the patient including all alternatives, risks, complications.  -Sharply debrided the nails x10 without any complications or bleeding today.   -He was previously using the compound medications of other oral medication, Lamisil for nail fungus.  Not see much improvement.  We discussed urea nail gel to help with the thickening of the toenails. -Patient encouraged to call the office with any questions, concerns, change in symptoms.   Austin Glass DPM

## 2023-03-18 ENCOUNTER — Telehealth: Payer: Self-pay | Admitting: Pulmonary Disease

## 2023-03-18 NOTE — Telephone Encounter (Signed)
 PT has had 2 rounds of pneumonia (Urgent Care). SOB when walking. States they have been thru 2 rounds of pred and Antibx.Wife is concerned.  Denies Fever Very Wet cough (moisture from nose) Nothing coming up when he coughs Cronic Cough during the day. Will wakeup at night. No wheezing  Over the counter cough medicine he was given (not codine)  Please call wife @ [phone] No appts avail. Wife says he will not go to the ER again. Urgent care Dr. York Spaniel he should go to the hospital but PT would not go. Please call to advise.

## 2023-03-19 MED ORDER — LEVOFLOXACIN 750 MG PO TABS: 750.0000 mg | ORAL_TABLET | Freq: Every day | ORAL | 0 refills | Status: DC

## 2023-03-19 MED ORDER — PREDNISONE 10 MG PO TABS: 60.0000 mg | ORAL_TABLET | Freq: Every day | ORAL | 0 refills | Status: DC

## 2023-03-19 NOTE — Telephone Encounter (Signed)
 Called and discussed with patient's wife. He has already doxycycline and z pack with persistent productive cough, dyspnea.  He does not want to go to the emergency room.  Has allergy to penicillin Will call in levofloxacin and another round of prednisone starting at 60 mg. Instructed wife that if this does not work then he will need to go to the hospital as we would have tried on possible outpatient therapies.  Nothing further needed

## 2023-03-28 ENCOUNTER — Emergency Department (HOSPITAL_COMMUNITY): Payer: PPO

## 2023-03-28 ENCOUNTER — Encounter (HOSPITAL_COMMUNITY): Payer: Self-pay

## 2023-03-28 ENCOUNTER — Observation Stay (HOSPITAL_COMMUNITY)
Admission: EM | Admit: 2023-03-28 | Discharge: 2023-03-29 | Disposition: A | Discharge status: Home / Self Care | Payer: PPO | Attending: Neurology | Admitting: Neurology

## 2023-03-28 ENCOUNTER — Other Ambulatory Visit: Payer: Self-pay

## 2023-03-28 DIAGNOSIS — J441 Chronic obstructive pulmonary disease with (acute) exacerbation: Secondary | ICD-10-CM | POA: Diagnosis not present

## 2023-03-28 DIAGNOSIS — Z860101 Personal history of adenomatous and serrated colon polyps: Secondary | ICD-10-CM | POA: Insufficient documentation

## 2023-03-28 DIAGNOSIS — J189 Pneumonia, unspecified organism: Secondary | ICD-10-CM

## 2023-03-28 DIAGNOSIS — F17210 Nicotine dependence, cigarettes, uncomplicated: Secondary | ICD-10-CM | POA: Insufficient documentation

## 2023-03-28 DIAGNOSIS — Z8719 Personal history of other diseases of the digestive system: Secondary | ICD-10-CM

## 2023-03-28 DIAGNOSIS — K219 Gastro-esophageal reflux disease without esophagitis: Secondary | ICD-10-CM

## 2023-03-28 DIAGNOSIS — G4700 Insomnia, unspecified: Secondary | ICD-10-CM

## 2023-03-28 DIAGNOSIS — Z79899 Other long term (current) drug therapy: Secondary | ICD-10-CM | POA: Insufficient documentation

## 2023-03-28 DIAGNOSIS — K644 Residual hemorrhoidal skin tags: Secondary | ICD-10-CM | POA: Insufficient documentation

## 2023-03-28 DIAGNOSIS — Z299 Encounter for prophylactic measures, unspecified: Secondary | ICD-10-CM | POA: Insufficient documentation

## 2023-03-28 DIAGNOSIS — Z1152 Encounter for screening for COVID-19: Secondary | ICD-10-CM | POA: Insufficient documentation

## 2023-03-28 DIAGNOSIS — R0602 Shortness of breath: Secondary | ICD-10-CM

## 2023-03-28 HISTORY — DX: Personal history of other diseases of the digestive system: Z87.19

## 2023-03-28 HISTORY — DX: Nicotine dependence, cigarettes, uncomplicated: F17.210

## 2023-03-28 HISTORY — DX: Insomnia, unspecified: G47.00

## 2023-03-28 HISTORY — DX: Chronic obstructive pulmonary disease with (acute) exacerbation: J44.1

## 2023-03-28 HISTORY — DX: Gastro-esophageal reflux disease without esophagitis: K21.9

## 2023-03-28 HISTORY — DX: Pneumonia, unspecified organism: J18.9

## 2023-03-28 LAB — COMPREHENSIVE METABOLIC PANEL
ALT: 20 U/L (ref 0–44)
AST: 19 U/L (ref 15–41)
Albumin: 3.5 g/dL (ref 3.5–5.0)
Alkaline Phosphatase: 44 U/L (ref 38–126)
Anion gap: 7 (ref 5–15)
BUN: 16 mg/dL (ref 8–23)
CO2: 30 mmol/L (ref 22–32)
Calcium: 8.7 mg/dL — ABNORMAL LOW (ref 8.9–10.3)
Chloride: 104 mmol/L (ref 98–111)
Creatinine, Ser: 0.93 mg/dL (ref 0.61–1.24)
GFR, Estimated: >60 mL/min (ref >60)
Glucose, Bld: 132 mg/dL — ABNORMAL HIGH (ref 70–99)
Potassium: 4.2 mmol/L (ref 3.5–5.1)
Sodium: 141 mmol/L (ref 135–145)
Total Bilirubin: 0.7 mg/dL (ref 0.0–1.2)
Total Protein: 5.8 g/dL — ABNORMAL LOW (ref 6.5–8.1)

## 2023-03-28 LAB — RESP PANEL BY RT-PCR (RSV, FLU A&B, COVID)  RVPGX2
Influenza A by PCR: NEGATIVE
Influenza B by PCR: NEGATIVE
Resp Syncytial Virus by PCR: NEGATIVE
SARS Coronavirus 2 by RT PCR: NEGATIVE

## 2023-03-28 LAB — CBC
HCT: 45.5 % (ref 39.0–52.0)
Hemoglobin: 15 g/dL (ref 13.0–17.0)
MCH: 31.7 pg (ref 26.0–34.0)
MCHC: 33 g/dL (ref 30.0–36.0)
MCV: 96.2 fL (ref 80.0–100.0)
Platelets: 274 10*3/uL (ref 150–400)
RBC: 4.73 MIL/uL (ref 4.22–5.81)
RDW: 15 % (ref 11.5–15.5)
WBC: 12.1 10*3/uL — ABNORMAL HIGH (ref 4.0–10.5)
nRBC: 0 % (ref 0.0–0.2)

## 2023-03-28 LAB — TROPONIN I (HIGH SENSITIVITY): Troponin I (High Sensitivity): 8 ng/L (ref <18)

## 2023-03-28 LAB — BRAIN NATRIURETIC PEPTIDE: B Natriuretic Peptide: 71.2 pg/mL (ref 0.0–100.0)

## 2023-03-28 MED ORDER — METHYLPREDNISOLONE SODIUM SUCC 125 MG IJ SOLR
125.0000 mg | Freq: Once | INTRAMUSCULAR | Status: AC
  Administered 2023-03-28: 125 mg via INTRAVENOUS
  Filled 2023-03-28: qty 2

## 2023-03-28 MED ORDER — ONDANSETRON HCL 4 MG/2ML IJ SOLN: 4.0000 mg | Freq: Four times a day (QID) | INTRAMUSCULAR | Status: DC | PRN

## 2023-03-28 MED ORDER — FLUTICASONE FUROATE-VILANTEROL 200-25 MCG/ACT IN AEPB
1.0000 | INHALATION_SPRAY | Freq: Every day | RESPIRATORY_TRACT | Status: DC
  Administered 2023-03-29: 1 via RESPIRATORY_TRACT
  Filled 2023-03-28: qty 28

## 2023-03-28 MED ORDER — ACETAMINOPHEN 325 MG PO TABS: 650.0000 mg | ORAL_TABLET | Freq: Four times a day (QID) | ORAL | Status: DC | PRN

## 2023-03-28 MED ORDER — GUAIFENESIN ER 600 MG PO TB12
600.0000 mg | ORAL_TABLET | Freq: Two times a day (BID) | ORAL | Status: DC
  Administered 2023-03-28 – 2023-03-29 (×2): 600 mg via ORAL
  Filled 2023-03-28 (×2): qty 1

## 2023-03-28 MED ORDER — PANTOPRAZOLE SODIUM 40 MG PO TBEC
40.0000 mg | DELAYED_RELEASE_TABLET | Freq: Every day | ORAL | Status: DC
  Administered 2023-03-28 – 2023-03-29 (×2): 40 mg via ORAL
  Filled 2023-03-28 (×2): qty 1

## 2023-03-28 MED ORDER — METHYLPREDNISOLONE SODIUM SUCC 40 MG IJ SOLR
40.0000 mg | Freq: Two times a day (BID) | INTRAMUSCULAR | Status: DC
Start: 2023-03-29 — End: 2023-03-29
  Administered 2023-03-29: 40 mg via INTRAVENOUS
  Filled 2023-03-28: qty 1

## 2023-03-28 MED ORDER — SODIUM CHLORIDE 0.9% FLUSH: 3.0000 mL | INTRAVENOUS | Status: DC | PRN

## 2023-03-28 MED ORDER — SODIUM CHLORIDE 0.9 % IV SOLN
2.0000 g | INTRAVENOUS | Status: DC
Start: 2023-03-29 — End: 2023-03-28

## 2023-03-28 MED ORDER — IPRATROPIUM-ALBUTEROL 0.5-2.5 (3) MG/3ML IN SOLN
3.0000 mL | Freq: Once | RESPIRATORY_TRACT | Status: AC
Start: 2023-03-28 — End: 2023-03-28
  Administered 2023-03-28: 3 mL via RESPIRATORY_TRACT
  Filled 2023-03-28: qty 3

## 2023-03-28 MED ORDER — IPRATROPIUM-ALBUTEROL 0.5-2.5 (3) MG/3ML IN SOLN
3.0000 mL | RESPIRATORY_TRACT | Status: AC
  Administered 2023-03-28 (×2): 3 mL via RESPIRATORY_TRACT
  Filled 2023-03-28: qty 6

## 2023-03-28 MED ORDER — UMECLIDINIUM BROMIDE 62.5 MCG/ACT IN AEPB
1.0000 | INHALATION_SPRAY | Freq: Every day | RESPIRATORY_TRACT | Status: DC
  Administered 2023-03-29: 1 via RESPIRATORY_TRACT
  Filled 2023-03-28: qty 7

## 2023-03-28 MED ORDER — CEFTRIAXONE SODIUM 2 G IJ SOLR: 2.0000 g | INTRAMUSCULAR | Status: DC

## 2023-03-28 MED ORDER — SODIUM CHLORIDE 0.9 % IV SOLN
2.0000 g | Freq: Once | INTRAVENOUS | Status: AC
  Administered 2023-03-28: 2 g via INTRAVENOUS
  Filled 2023-03-28: qty 20

## 2023-03-28 MED ORDER — SODIUM CHLORIDE 0.9% FLUSH
3.0000 mL | Freq: Two times a day (BID) | INTRAVENOUS | Status: DC
  Administered 2023-03-28 – 2023-03-29 (×2): 3 mL via INTRAVENOUS

## 2023-03-28 MED ORDER — SODIUM CHLORIDE 0.9 % IV SOLN: 250.0000 mL | INTRAVENOUS | Status: DC | PRN

## 2023-03-28 MED ORDER — IPRATROPIUM-ALBUTEROL 0.5-2.5 (3) MG/3ML IN SOLN: 3.0000 mL | RESPIRATORY_TRACT | Status: DC | PRN

## 2023-03-28 MED ORDER — BUDESON-GLYCOPYRROL-FORMOTEROL 160-9-4.8 MCG/ACT IN AERO: 2.0000 | INHALATION_SPRAY | Freq: Two times a day (BID) | RESPIRATORY_TRACT | Status: DC

## 2023-03-28 MED ORDER — TRAZODONE HCL 50 MG PO TABS
100.0000 mg | ORAL_TABLET | Freq: Every evening | ORAL | Status: DC | PRN
  Administered 2023-03-28: 100 mg via ORAL
  Filled 2023-03-28: qty 2

## 2023-03-28 MED ORDER — SODIUM CHLORIDE 0.9 % IV SOLN
2.0000 g | INTRAVENOUS | Status: DC
Start: 2023-03-28 — End: 2023-03-28

## 2023-03-28 MED ORDER — ACETAMINOPHEN 650 MG RE SUPP: 650.0000 mg | Freq: Four times a day (QID) | RECTAL | Status: DC | PRN

## 2023-03-28 MED ORDER — NICOTINE 14 MG/24HR TD PT24
14.0000 mg | MEDICATED_PATCH | Freq: Every day | TRANSDERMAL | Status: DC
  Administered 2023-03-28 – 2023-03-29 (×2): 14 mg via TRANSDERMAL
  Filled 2023-03-28 (×2): qty 1

## 2023-03-28 MED ORDER — SODIUM CHLORIDE 0.9 % IV SOLN
100.0000 mg | Freq: Two times a day (BID) | INTRAVENOUS | Status: DC
  Administered 2023-03-28 – 2023-03-29 (×2): 100 mg via INTRAVENOUS
  Filled 2023-03-28 (×3): qty 100

## 2023-03-28 MED ORDER — MAGNESIUM SULFATE 2 GM/50ML IV SOLN
2.0000 g | Freq: Once | INTRAVENOUS | Status: AC
  Administered 2023-03-28: 2 g via INTRAVENOUS
  Filled 2023-03-28: qty 50

## 2023-03-28 MED ORDER — ONDANSETRON HCL 4 MG PO TABS: 4.0000 mg | ORAL_TABLET | Freq: Four times a day (QID) | ORAL | Status: DC | PRN

## 2023-03-28 NOTE — ED Triage Notes (Signed)
 Pt c/o SOBx29mo. Pt denies any other sx.

## 2023-03-28 NOTE — ED Provider Triage Note (Signed)
 Emergency Medicine Provider Triage Evaluation Note  Austin Glass , a 68 y.o. male  was evaluated in triage.  Pt complains of SOBx1 month. Denies fever, chills. Endorses persistent intermittent dry and wet cough. SOB worse with walking. No chest pain. No recent surgeries, falls, or hormonal therapy.  Review of Systems  Positive: sob Negative: Chest pain  Physical Exam  BP (!) 161/77 (BP Location: Right Arm)   Pulse 81   Temp 98.6 F (37 C)   Resp (!) 24   Ht 5\' 9"  (1.753 m)   Wt 77.4 kg   SpO2 94%   BMI 25.20 kg/m  Gen:   Awake, no distress   Resp:  Normal effort  MSK:   Moves extremities without difficulty  Other:  +rhonchi of bilateral upper lobes  Medical Decision Making  Medically screening exam initiated at 2:45 PM.  Appropriate orders placed.  Jeanene Erb was informed that the remainder of the evaluation will be completed by another provider, this initial triage assessment does not replace that evaluation, and the importance of remaining in the ED until their evaluation is complete.     Pete Pelt, [place] 03/28/23 [phone]

## 2023-03-28 NOTE — ED Provider Notes (Signed)
 [place] EMERGENCY DEPARTMENT AT [facility] Provider Note  Arrival date/time:03/28/2023 7:52 PM  HPI/ROS   Austin Glass is a 68 y.o. male with PMH significant for COPD who presents for shortness of breath. History is provided by patient.  Patient endorses shortness of breath worsening this morning.  He has been experiencing intermittent shortness of breath for the past month or so and has been on multiple doses of antibiotics and steroids.  Currently he is taking levofloxacin and prednisone.  Patient understands that shortness of breath does improve about 5 minutes after he takes a home inhaler.  However he occasionally will feel like he cannot take a breath. Does have occasional cough with productive sputum.  Wife endorses that he was satting in the 40s at home.  A complete ROS was performed with pertinent positives/negatives noted above.   ED Course and Medical Decision Making   I personally reviewed the patient's vitals.  ER Provider interpretation of labs: CMP shows no metabolic arrangement, no AKI, no liver injury CBC shows very mild leukocytosis to 12.1, no anemia Respiratory pathogen panel is negative for COVID flu and RSV   Assessment/Plan: 68 year old patient presenting with shortness of breath.  Patient follows with pulmonology, Chilton Greathouse, MD.  Most recently he has been on a course of doxycycline as well as a Z-Pak. He was started on levofloxacin and prednisone by his pulmonologist a few days ago.  Patient is given 3 back-to-back DuoNebs as well as IV magnesium.  He is currently already taking prednisone 60 mg daily.  Patient's lab workup here reveals very mild leukocytosis in the setting of currently being on prednisone. His chest x-ray shows concern for lower lobe pneumonia.  Given concern for failed outpatient antibiotics will plan to admit patient for IV antibiotics for his  pneumonia  Disposition:  I discussed the case with hospitalist team  who graciously agreed to admit the patient to their service for continued care.   Clinical Impression:  1. Pneumonia of left lower lobe due to infectious organism   2. SOB (shortness of breath)     Rx / DC Orders ED Discharge Orders     None       The plan for this patient was discussed with Dr. Rush Landmark, who voiced agreement and who oversaw evaluation and treatment of this patient.   Clinical Complexity A medically appropriate history, review of systems, and physical exam was performed.  Patient's presentation is most consistent with acute presentation with potential threat to life or bodily function.  Medical Decision Making Amount and/or Complexity of Data Reviewed Labs: ordered. Radiology:  Decision-making details documented in ED Course.  Risk Prescription drug management. Decision regarding hospitalization.    Physical Exam and Medical History   Vitals:   03/28/23 1627 03/28/23 1815 03/28/23 1900 03/28/23 1943  BP: (!) 161/87 (!) 113/51 (!) 118/53   Pulse: 75 67 70   Resp: 17 18 15    Temp:    98.1 F (36.7 C)  TempSrc:    Oral  SpO2: 98% 92% 94%   Weight:      Height:        Physical Exam Vitals and nursing note reviewed.  Constitutional:      General: He is not in acute distress.    Appearance: He is well-developed.  HENT:     Head: Normocephalic and atraumatic.  Eyes:     Conjunctiva/sclera: Conjunctivae normal.  Cardiovascular:     Rate and Rhythm: Normal rate and regular rhythm.  Heart sounds: No murmur heard. Pulmonary:     Effort: Pulmonary effort is normal. No respiratory distress.     Comments: Inspiratory and expiratory wheezing  Abdominal:     Palpations: Abdomen is soft.     Tenderness: There is no abdominal tenderness.  Musculoskeletal:        General: No swelling.     Cervical back: Neck supple.  Skin:    General: Skin is warm and dry.     Capillary Refill: Capillary refill takes less than 2 seconds.  Neurological:      Mental Status: He is alert.  Psychiatric:        Mood and Affect: Mood normal.     Medical History: Allergies  Allergen Reactions   Penicillins Rash   Past Medical History:  Diagnosis Date   Anxiety    Hx of adenomatous colonic polyps 01/04/2016   Hypertrophy of nasal turbinates    Sinus disease    Smoker     Past Surgical History:  Procedure Laterality Date   CARPAL TUNNEL RELEASE Left    COLONOSCOPY     ETHMOIDECTOMY Bilateral 12/05/2015   Procedure: BILATERAL TOTAL  ETHMOIDECTOMY, MAXILLARY OSTEA  ENLARGEMENT;  Surgeon: Drema Halon, MD;  Location: [facility];  Service: ENT;  Laterality: Bilateral;   EYE SURGERY     cataract   HERNIA REPAIR     UHR   SINUS ENDO WITH FUSION Bilateral 12/05/2015   Procedure: FUCTIONAL  ENDOSCOPIC SINUS SURGERY WITH FUSION NAVIGATION;  Surgeon: Drema Halon, MD;  Location: [facility];  Service: ENT;  Laterality: Bilateral;   TURBINATE REDUCTION Bilateral 12/05/2015   Procedure: BILATERAL TURBINATE REDUCTION;  Surgeon: Drema Halon, MD;  Location: [facility];  Service: ENT;  Laterality: Bilateral;   ULNAR NERVE REPAIR Left    decompression   Family History  Problem Relation Age of Onset   Liver cancer Mother    Colon polyps Father    Ehlers-Danlos syndrome Sister    Colon cancer Neg Hx    Stomach cancer Neg Hx    Rectal cancer Neg Hx    Pancreatic cancer Neg Hx     Social History   Tobacco Use   Smoking status: Every Day    Current packs/day: 0.20    Types: Cigarettes   Smokeless tobacco: Never   Tobacco comments:    3-4 cigarettes per day 12/31/21  Vaping Use   Vaping status: Never Used  Substance Use Topics   Alcohol use: Not Currently    Comment: once monthly   Drug use: No    Procedures   If procedures were preformed on this patient, they are listed below:  Procedures   -------- HPI and MDM generated using voice dictation software and may  contain dictation errors. Please contact me for any clarification or with any questions.   Cephus Slater, MD Emergency Medicine PGY-2    Caron Presume, MD 03/28/23 1952    Tegeler, Canary Brim, MD 03/30/23 1409

## 2023-03-28 NOTE — H&P (Signed)
 History and Physical    Austin Glass ZHY:[id number] DOB: February 07, 1955 DOA: 03/28/2023  PCP: Kaleen Mask, MD   Patient coming from: Home   Chief Complaint:  Chief Complaint  Patient presents with   Shortness of Breath   ED TRIAGE note:Pt c/o [id number]. Pt denies any other sx.   HPI:  Austin Glass is a 68 y.o. male with medical history significant of asthma?,COPD, chronic smoker, GERD, and insomnia presented to emergency department complaining of shortness of breath and cough which has been progressively getting worse.  Patient reports that he has experiencing intermittent shortness of breath over the last month and has been tried multiple doses of antibiotic and steroid without much relief.  Currently he has been taking levofloxacin and prednisone without any improvement.  Patient reported that he has been using albuterol inhaler that improved shortness of breath for 5 minutes then started having shortness of breath again. Wife at the bedside reported that at home his oxygen saturation dropped to 70% at room air. Since patient here in the emergency department even before the nebulizer treatment O2 satting between 94 to 97% at rest.  Patient reported productive cough and wheezing.  Denies any chest pain, palpitation, fever, chill, nausea nausea and diarrhea.  No known sick contact.   ED Course:  At presentation to ED patient is hemodynamically stable O2 sat 98 to 94% on room air. Normal BNP. Normal troponin level x 2. CBC showing mild leukocytosis 12.1 otherwise unremarkable. CMP unremarkable. Respiratory panel negative for COVID, flu and RSV.  Chest x-ray showing hyperinflated lung field in the setting of COPD.  Left basilar patchy opacity represent superimposed infection.  In the ED patient has been treated with ceftriaxone, mag sulfate and DuoNeb nebulizer x 2.  Hospitalist has been consulted for further evaluation management of COPD exacerbation in setting of  pneumonia.  Significant labs in the ED: Lab Orders         Resp panel by RT-PCR (RSV, Flu A&B, Covid) Anterior Nasal Swab         Culture, blood (routine x 2) Call MD if unable to obtain prior to antibiotics being given         Expectorated Sputum Assessment w Gram Stain, Rflx to Resp Cult         Comprehensive metabolic panel         CBC         Brain natriuretic peptide         HIV Antibody (routine testing w rflx)         Legionella Pneumophila Serogp 1 Ur Ag         Strep pneumoniae urinary antigen         Procalcitonin         Comprehensive metabolic panel         CBC       Review of Systems:  Review of Systems  Constitutional:  Negative for chills, fever, malaise/fatigue and weight loss.  Respiratory:  Positive for cough, sputum production, shortness of breath and wheezing.   Cardiovascular:  Negative for chest pain and palpitations.  Gastrointestinal:  Negative for abdominal pain, heartburn, nausea and vomiting.  Musculoskeletal:  Negative for back pain, joint pain, myalgias and neck pain.  Neurological:  Negative for dizziness and headaches.  Psychiatric/Behavioral:  The patient is not nervous/anxious.   All other systems reviewed and are negative.   Past Medical History:  Diagnosis Date   Anxiety    Hx of  adenomatous colonic polyps 01/04/2016   Hypertrophy of nasal turbinates    Sinus disease    Smoker     Past Surgical History:  Procedure Laterality Date   CARPAL TUNNEL RELEASE Left    COLONOSCOPY     ETHMOIDECTOMY Bilateral 12/05/2015   Procedure: BILATERAL TOTAL  ETHMOIDECTOMY, MAXILLARY OSTEA  ENLARGEMENT;  Surgeon: Drema Halon, MD;  Location: [facility];  Service: ENT;  Laterality: Bilateral;   EYE SURGERY     cataract   HERNIA REPAIR     UHR   SINUS ENDO WITH FUSION Bilateral 12/05/2015   Procedure: FUCTIONAL  ENDOSCOPIC SINUS SURGERY WITH FUSION NAVIGATION;  Surgeon: Drema Halon, MD;  Location: [facility];  Service: ENT;  Laterality: Bilateral;   TURBINATE REDUCTION Bilateral 12/05/2015   Procedure: BILATERAL TURBINATE REDUCTION;  Surgeon: Drema Halon, MD;  Location: [facility];  Service: ENT;  Laterality: Bilateral;   ULNAR NERVE REPAIR Left    decompression     reports that he has been smoking cigarettes. He has never used smokeless tobacco. He reports that he does not currently use alcohol. He reports that he does not use drugs.  Allergies  Allergen Reactions   Penicillins Rash    Family History  Problem Relation Age of Onset   Liver cancer Mother    Colon polyps Father    Ehlers-Danlos syndrome Sister    Colon cancer Neg Hx    Stomach cancer Neg Hx    Rectal cancer Neg Hx    Pancreatic cancer Neg Hx     Prior to Admission medications   Medication Sig Start Date End Date Taking? Authorizing Provider  albuterol (VENTOLIN HFA) 108 (90 Base) MCG/ACT inhaler Inhale 2 puffs into the lungs every 6 (six) hours as needed for wheezing or shortness of breath.    [provider]  BREZTRI AEROSPHERE 160-9-4.8 MCG/ACT AERO Inhale into the lungs. 10/27/20   [provider]  Budeson-Glycopyrrol-Formoterol (BREZTRI AEROSPHERE) 160-9-4.8 MCG/ACT AERO Inhale 2 puffs into the lungs every 12 (twelve) hours. 07/22/22     levofloxacin (LEVAQUIN) 750 MG tablet Take 1 tablet (750 mg total) by mouth daily. 03/19/23   Mannam, Colbert Coyer, MD  nicotine (NICODERM CQ - DOSED IN MG/24 HOURS) 14 mg/24hr patch Place 1 patch (14 mg total) onto the skin daily. 10/21/22   Chilton Greathouse, MD  omeprazole (PRILOSEC) 40 MG capsule Take 1 capsule (40 mg total) by mouth daily before breakfast. 12/12/22   Iva Boop, MD  predniSONE (DELTASONE) 10 MG tablet Take 6 tablets (60 mg total) by mouth daily with breakfast. Take 60 mg/day on day 1 and reduce dose by 10 mg every 2 days until taper is complete 03/19/23   Mannam, Praveen, MD  tadalafil (CIALIS) 20 MG tablet Take 20 mg by  mouth daily as needed. 10/31/21   [provider]  traZODone (DESYREL) 150 MG tablet Take by mouth at bedtime.    [provider]  traZODone (DESYREL) 150 MG tablet Take 0.5-1 tablets (75-150 mg total) by mouth at bedtime for sleep or mood. 12/05/22     Varenicline Tartrate, Starter, (CHANTIX STARTING MONTH PAK) 0.5 MG X 11 & 1 MG X 42 TBPK Take 1 tablet by mouth daily. 10/21/22   Chilton Greathouse, MD     Physical Exam: Vitals:   03/28/23 1627 03/28/23 1815 03/28/23 1900 03/28/23 1943  BP: (!) 161/87 (!) 113/51 (!) 118/53   Pulse: 75 67 70  Resp: 17 18 15    Temp:    98.1 F (36.7 C)  TempSrc:    Oral  SpO2: 98% 92% 94%   Weight:      Height:        Physical Exam Vitals and nursing note reviewed.  Constitutional:      Appearance: He is not ill-appearing.  Eyes:     Pupils: Pupils are equal, round, and reactive to light.  Cardiovascular:     Rate and Rhythm: Normal rate and regular rhythm.  Pulmonary:     Effort: Pulmonary effort is normal.     Breath sounds: Wheezing present. No decreased breath sounds, rhonchi or rales.  Skin:    Coloration: Skin is not cyanotic.     Findings: No erythema.     Nails: There is no clubbing.  Neurological:     Mental Status: He is alert and oriented to person, place, and time.  Psychiatric:        Mood and Affect: Mood normal.      Labs on Admission: I have personally reviewed following labs and imaging studies  CBC: Recent Labs  Lab 03/28/23 1417  WBC 12.1*  HGB 15.0  HCT 45.5  MCV 96.2  PLT 274   Basic Metabolic Panel: Recent Labs  Lab 03/28/23 1417  NA 141  K 4.2  CL 104  CO2 30  GLUCOSE 132*  BUN 16  CREATININE 0.93  CALCIUM 8.7*   GFR: Estimated Creatinine Clearance: 76 mL/min (by C-G formula based on SCr of 0.93 mg/dL). Liver Function Tests: Recent Labs  Lab 03/28/23 1417  AST 19  ALT 20  ALKPHOS 44  BILITOT 0.7  PROT 5.8*  ALBUMIN 3.5   No results for input(s): "LIPASE", "AMYLASE" in  the last 168 hours. No results for input(s): "AMMONIA" in the last 168 hours. Coagulation Profile: No results for input(s): "INR", "PROTIME" in the last 168 hours. Cardiac Enzymes: Recent Labs  Lab 03/28/23 1631  TROPONINIHS 8   BNP (last 3 results) Recent Labs    03/28/23 1631  BNP 71.2   HbA1C: No results for input(s): "HGBA1C" in the last 72 hours. CBG: No results for input(s): "GLUCAP" in the last 168 hours. Lipid Profile: No results for input(s): "CHOL", "HDL", "LDLCALC", "TRIG", "CHOLHDL", "LDLDIRECT" in the last 72 hours. Thyroid Function Tests: No results for input(s): "TSH", "T4TOTAL", "FREET4", "T3FREE", "THYROIDAB" in the last 72 hours. Anemia Panel: No results for input(s): "VITAMINB12", "FOLATE", "FERRITIN", "TIBC", "IRON", "RETICCTPCT" in the last 72 hours. Urine analysis: No results found for: "COLORURINE", "APPEARANCEUR", "LABSPEC", "PHURINE", "GLUCOSEU", "HGBUR", "BILIRUBINUR", "KETONESUR", "PROTEINUR", "UROBILINOGEN", "NITRITE", "LEUKOCYTESUR"  Radiological Exams on Admission: I have personally reviewed images DG Chest 2 View Result Date: 03/28/2023 CLINICAL DATA:  One-month history of worsening shortness of breath EXAM: CHEST - 2 VIEW COMPARISON:  Chest radiograph dated 12/28/2020 FINDINGS: Hyperinflated lungs. Left basilar patchy opacities. No pleural effusion or pneumothorax. The heart size and mediastinal contours are within normal limits. No acute osseous abnormality. IMPRESSION: 1. Hyperinflated lungs, which can be seen in the setting of COPD. 2. Left basilar patchy opacities, which may represent superimposed infection. Electronically Signed   By: Agustin Cree M.D.   On: 03/28/2023 15:45     EKG: My personal interpretation of EKG shows: EKG showing sinus rhythm with premature ventricular complex.  Heart rate around 82.  Left axis deviation.  Incomplete right bundle branch block.     Assessment/Plan: Principal Problem:   COPD with acute exacerbation  (HCC) Active  Problems:   CAP (community acquired pneumonia)   Hx of adenomatous colonic polyps   History of hemorrhoids   Continuous dependence on cigarette smoking   GERD (gastroesophageal reflux disease)   Insomnia   COPD exacerbation (HCC)    Assessment and Plan: COPD exacerbation > Patient presented to emergency department complaining of shortness of breath which has been not improving with albuterol inhaler at home.  He is also endorsing cough.  Currently has been treating with oral prednisone and levofloxacin without any improvement.  Wife reported O2 sat dropped to 70% room air but since patient is in the ED O2 sat around 94 to 98% on room air even before the nebulizer treatment. -Physical exam showing bilateral upper lower lung field wheezing. - In the ED patient has been treated with DuoNeb nebulizer x 2. - Giving Solu-Medrol 125 mg followed by IV Solu-Medrol 40 mg twice daily for 4 days course. -Continue budesonide-glycopyrrolate-formoterol inhaler twice daily and DuoNeb every 4 hours as needed for wheezing shortness of breath. - Continue supplemental oxygen as needed to O2 sat above 92% and supportive care. - Continue empiric antibiotic for the management of pneumonia as discussed below.  Community-acquired pneumonia -Patient failed outpatient treatment with levofloxacin. - Afebrile.  Mild leukocytosis 12.1 in the setting of prednisone use. -Chest x-ray showing left basilar infiltrate superimposed infection. - Respiratory panel negative for COVID, RSV and flu. -Obtaining blood culture, sputum culture, urine Legionella and Streptococcus antigen.  Checking procalcitonin level. - Continue IV ceftriaxone 2 gm daily and IV doxycycline 100 mg twice daily. - Will follow-up with culture results for antibiotic sensitivity.   History of chronic smoking cigarette -Continue nicotine patch. -Counseled patient at the bedside for smoking cessation.  History of hemorrhoid History of  colonic adenomatous polyp -Patient has history of GI bleed due to hemorrhoidal bleeding.  Stable H&H.  Continue to monitor.  GERD -Continue Protonix  Insomnia -Continue trazodone as needed  DVT prophylaxis:  SCDs.  Deferring pharmacological DVT prophylaxis given patient has GI bleed due to hemorrhoidal bleeding past. Code Status:  Full Code Diet: Heart healthy diet Family Communication:   Family was present at bedside, at the time of interview. Opportunity was given to ask question and all questions were answered satisfactorily.  Disposition Plan: Continue to monitor for improvement of shortness of breath.  Pending culture results.  Tentative discharge to home next 2 days. Consults: None at this time Admission status:   Observation, Telemetry bed  Severity of Illness: The appropriate patient status for this patient is OBSERVATION. Observation status is judged to be reasonable and necessary in order to provide the required intensity of service to ensure the patient's safety. The patient's presenting symptoms, physical exam findings, and initial radiographic and laboratory data in the context of their medical condition is felt to place them at decreased risk for further clinical deterioration. Furthermore, it is anticipated that the patient will be medically stable for discharge from the hospital within 2 midnights of admission.     Tereasa Coop, MD [facility]  How to contact the [facility] Attending or Consulting provider 7A - 7P or covering provider during after hours 7P -7A, for this patient.  Check the care team in [facility] and look for a) attending/consulting [facility] provider listed and b) the [facility] team listed Log into [email or web address] and use [place]'s universal password to access. If you do not have the password, please contact the hospital operator. Locate the [facility] provider you are looking for under [facility] and  page to a number that you can be directly reached. If you still  have difficulty reaching the provider, please page the [facility] (Director on Call) for the Hospitalists listed on amion for assistance.  03/28/2023, 8:46 PM

## 2023-03-28 NOTE — ED Notes (Signed)
 Ambulatory to bathroom. Gait steady. No respiratory distress noted. O2 sat 97% after ambulation.

## 2023-03-28 NOTE — Progress Notes (Signed)
 RN obtained ambulatory oxygen check.   Patient had some dyspnea with ambulation however oxygen saturation maintained mid 90s.  Tereasa Coop, MD [facility] 03/28/2023, 10:20 PM

## 2023-03-29 DIAGNOSIS — J441 Chronic obstructive pulmonary disease with (acute) exacerbation: Secondary | ICD-10-CM | POA: Diagnosis not present

## 2023-03-29 LAB — TROPONIN I (HIGH SENSITIVITY): Troponin I (High Sensitivity): 6 ng/L (ref <18)

## 2023-03-29 LAB — CBC
HCT: 44.6 % (ref 39.0–52.0)
Hemoglobin: 14.9 g/dL (ref 13.0–17.0)
MCH: 31.5 pg (ref 26.0–34.0)
MCHC: 33.4 g/dL (ref 30.0–36.0)
MCV: 94.3 fL (ref 80.0–100.0)
Platelets: 274 10*3/uL (ref 150–400)
RBC: 4.73 MIL/uL (ref 4.22–5.81)
RDW: 15 % (ref 11.5–15.5)
WBC: 9.5 10*3/uL (ref 4.0–10.5)
nRBC: 0 % (ref 0.0–0.2)

## 2023-03-29 LAB — COMPREHENSIVE METABOLIC PANEL
ALT: 19 U/L (ref 0–44)
AST: 18 U/L (ref 15–41)
Albumin: 3.4 g/dL — ABNORMAL LOW (ref 3.5–5.0)
Alkaline Phosphatase: 43 U/L (ref 38–126)
Anion gap: 9 (ref 5–15)
BUN: 14 mg/dL (ref 8–23)
CO2: 27 mmol/L (ref 22–32)
Calcium: 8.9 mg/dL (ref 8.9–10.3)
Chloride: 100 mmol/L (ref 98–111)
Creatinine, Ser: 0.9 mg/dL (ref 0.61–1.24)
GFR, Estimated: >60 mL/min (ref >60)
Glucose, Bld: 165 mg/dL — ABNORMAL HIGH (ref 70–99)
Potassium: 4.3 mmol/L (ref 3.5–5.1)
Sodium: 136 mmol/L (ref 135–145)
Total Bilirubin: 0.5 mg/dL (ref 0.0–1.2)
Total Protein: 5.8 g/dL — ABNORMAL LOW (ref 6.5–8.1)

## 2023-03-29 LAB — D-DIMER, QUANTITATIVE: D-Dimer, Quant: <0.27 ug{FEU}/mL (ref 0.00–0.50)

## 2023-03-29 LAB — STREP PNEUMONIAE URINARY ANTIGEN: Strep Pneumo Urinary Antigen: NEGATIVE

## 2023-03-29 LAB — HIV ANTIBODY (ROUTINE TESTING W REFLEX): HIV Screen 4th Generation wRfx: NONREACTIVE

## 2023-03-29 LAB — PROCALCITONIN: Procalcitonin: <0.1 ng/mL

## 2023-03-29 MED ORDER — ALBUTEROL SULFATE HFA 108 (90 BASE) MCG/ACT IN AERS: 2.0000 | INHALATION_SPRAY | Freq: Four times a day (QID) | RESPIRATORY_TRACT | Status: AC | PRN

## 2023-03-29 MED ORDER — DOXYCYCLINE HYCLATE 100 MG PO CAPS: 100.0000 mg | ORAL_CAPSULE | Freq: Two times a day (BID) | ORAL | 0 refills | Status: AC

## 2023-03-29 MED ORDER — CEFDINIR 300 MG PO CAPS: 300.0000 mg | ORAL_CAPSULE | Freq: Two times a day (BID) | ORAL | 0 refills | Status: AC

## 2023-03-29 MED ORDER — PREDNISONE 10 MG PO TABS: ORAL_TABLET | ORAL | 0 refills | Status: DC

## 2023-03-29 MED ORDER — IPRATROPIUM-ALBUTEROL 0.5-2.5 (3) MG/3ML IN SOLN: 3.0000 mL | RESPIRATORY_TRACT | 0 refills | Status: DC | PRN

## 2023-03-29 NOTE — Discharge Summary (Signed)
 Physician Discharge Summary   Patient: Austin Glass MRN: [id number] DOB: 1955/03/04  Admit date:     03/28/2023  Discharge date: 03/29/23  Discharge Physician: Jacquelin Hawking, MD   PCP: Kaleen Mask, MD   Recommendations at discharge:  PCP visit for hospital follow-up  Discharge Diagnoses: Principal Problem:   COPD with acute exacerbation [facility]) Active Problems:   CAP (community acquired pneumonia)   Hx of adenomatous colonic polyps   History of hemorrhoids   Continuous dependence on cigarette smoking   GERD (gastroesophageal reflux disease)   Insomnia   COPD exacerbation (HCC)  Resolved Problems:   * No resolved hospital problems. *  Hospital Course: Austin Glass is a 68 y.o. male with a history of COPD, tobacco use, GERD, insomnia.  Patient presented secondary to shortness of breath and cough with concern for COPD exacerbation with pneumonia. Patient started empirically on antibiotics and steroids with improvement of symptoms. D-dimer obtained and was negative. Patient discharged to continue steroids and antibiotics on discharge.  Assessment and Plan:  COPD exacerbation Patient with cough with only mild production; associated dyspnea on exertion. Patient started on Solu-medrol IV. Patient's symptoms improved and he was transitioned to prednisone with taper, in addition to oral antibiotics for pneumonia.  Community acquired pneumonia Mild leukocytosis with cough. Chest x-ray significant for let basilar patchy opacities. No chest pain. Dyspnea with exertion. Procalcitonin negative. Influenza, RSV, COVID-19 negative. Patient treated empirically on Ceftriaxone and doxycycline. Transitioned to Cefdinir and doxycycline on discharge.  Tobacco use Counseled on admission. Continue nicotine patch.  GERD Continue Prilosec.  Insomnia Continue trazodone.   Consultants: None Procedures performed: None  Disposition: Home Diet recommendation: Regular diet   DISCHARGE  MEDICATION: Allergies as of 03/29/2023       Reactions   Penicillins Rash        Medication List     STOP taking these medications    levofloxacin 500 MG tablet Commonly known as: LEVAQUIN       TAKE these medications    albuterol 108 (90 Base) MCG/ACT inhaler Commonly known as: VENTOLIN HFA Inhale 2 puffs into the lungs every 6 (six) hours as needed for wheezing or shortness of breath (Use when out of your home or if unable to reach your nebulizer machine.). What changed: reasons to take this   Breztri Aerosphere 160-9-4.8 MCG/ACT Aero Generic drug: Budeson-Glycopyrrol-Formoterol Inhale 2 puffs into the lungs every 12 (twelve) hours.   cefdinir 300 MG capsule Commonly known as: OMNICEF Take 1 capsule (300 mg total) by mouth 2 (two) times daily for 4 days.   doxycycline 100 MG capsule Commonly known as: VIBRAMYCIN Take 1 capsule (100 mg total) by mouth 2 (two) times daily for 4 days.   ibuprofen 200 MG tablet Commonly known as: ADVIL Take 400-600 mg by mouth every 6 (six) hours as needed for fever, headache, mild pain (pain score 1-3) or moderate pain (pain score 4-6).   ipratropium-albuterol 0.5-2.5 (3) MG/3ML Soln Commonly known as: DUONEB Take 3 mLs by nebulization every 4 (four) hours as needed (shortness of breath or wheezing. Use when at home.).   MYLANTA PO Take 1 Dose by mouth daily as needed.   nicotine 14 mg/24hr patch Commonly known as: NICODERM CQ - dosed in mg/24 hours Place 1 patch (14 mg total) onto the skin daily.   omeprazole 40 MG capsule Commonly known as: PRILOSEC Take 1 capsule (40 mg total) by mouth daily before breakfast.   predniSONE 10 MG tablet Commonly  known as: DELTASONE Take 4 tablets (40 mg total) by mouth daily with breakfast for 3 days, THEN 3 tablets (30 mg total) daily with breakfast for 3 days, THEN 2 tablets (20 mg total) daily with breakfast for 3 days, THEN 1 tablet (10 mg total) daily with breakfast for 3 days. Start  taking on: March 30, 2023 What changed: See the new instructions.   tadalafil 20 MG tablet Commonly known as: CIALIS Take 20 mg by mouth daily as needed.   traZODone 150 MG tablet Commonly known as: DESYREL Take 0.5-1 tablets (75-150 mg total) by mouth at bedtime for sleep or mood.   Vitamin D-3 125 MCG (5000 UT) Tabs Take 1 tablet by mouth daily.        Discharge Exam: BP (!) 126/55 (BP Location: Left Arm)   Pulse 71   Temp 98.1 F (36.7 C)   Resp 18   Ht 5\' 9"  (1.753 m)   Wt 77.4 kg   SpO2 95%   BMI 25.20 kg/m   General exam: Appears calm and comfortable Respiratory system: Mild wheezing mostly on left side. Diminished breath so unds. Respiratory effort normal. Cardiovascular system: S1 & S2 heard, RRR. Gastrointestinal system: Abdomen is nondistended, soft and nontender. No organomegaly or masses felt. Normal bowel sounds heard. Central nervous system: Alert and oriented. No focal neurological deficits. Musculoskeletal: No edema. No calf tenderness Psychiatry: Judgement and insight appear normal. Mood & affect appropriate.   Condition at discharge: stable  The results of significant diagnostics from this hospitalization (including imaging, microbiology, ancillary and laboratory) are listed below for reference.   Imaging Studies: DG Chest 2 View Result Date: 03/28/2023 CLINICAL DATA:  One-month history of worsening shortness of breath EXAM: CHEST - 2 VIEW COMPARISON:  Chest radiograph dated 12/28/2020 FINDINGS: Hyperinflated lungs. Left basilar patchy opacities. No pleural effusion or pneumothorax. The heart size and mediastinal contours are within normal limits. No acute osseous abnormality. IMPRESSION: 1. Hyperinflated lungs, which can be seen in the setting of COPD. 2. Left basilar patchy opacities, which may represent superimposed infection. Electronically Signed   By: Agustin Cree M.D.   On: 03/28/2023 15:45    Microbiology: Results for orders placed or performed  during the hospital encounter of 03/28/23  Resp panel by RT-PCR (RSV, Flu A&B, Covid) Anterior Nasal Swab     Status: None   Collection Time: 03/28/23  2:17 PM   Specimen: Anterior Nasal Swab  Result Value Ref Range Status   SARS Coronavirus 2 by RT PCR NEGATIVE NEGATIVE Final   Influenza A by PCR NEGATIVE NEGATIVE Final   Influenza B by PCR NEGATIVE NEGATIVE Final    Comment: (NOTE) The Xpert Xpress SARS-CoV-2/FLU/RSV plus assay is intended as an aid in the diagnosis of influenza from Nasopharyngeal swab specimens and should not be used as a sole basis for treatment. Nasal washings and aspirates are unacceptable for Xpert Xpress SARS-CoV-2/FLU/RSV testing.  Fact Sheet for Patients: BloggerCourse.com  Fact Sheet for Healthcare Providers: SeriousBroker.it  This test is not yet approved or cleared by the [place] FDA and has been authorized for detection and/or diagnosis of SARS-CoV-2 by FDA under an Emergency Use Authorization (EUA). This EUA will remain in effect (meaning this test can be used) for the duration of the COVID-19 declaration under Section 564(b)(1) of the Act, 21 U.S.C. section 360bbb-3(b)(1), unless the authorization is terminated or revoked.     Resp Syncytial Virus by PCR NEGATIVE NEGATIVE Final    Comment: (NOTE) Fact Sheet  for Patients: BloggerCourse.com  Fact Sheet for Healthcare Providers: SeriousBroker.it  This test is not yet approved or cleared by the [place] FDA and has been authorized for detection and/or diagnosis of SARS-CoV-2 by FDA under an Emergency Use Authorization (EUA). This EUA will remain in effect (meaning this test can be used) for the duration of the COVID-19 declaration under Section 564(b)(1) of the Act, 21 U.S.C. section 360bbb-3(b)(1), unless the authorization is terminated or revoked.  Performed at [facility]  Lab, [address], [place], [place] [address]   Culture, blood (routine x 2) Call MD if unable to obtain prior to antibiotics being given     Status: None (Preliminary result)   Collection Time: 03/28/23 11:14 PM   Specimen: BLOOD LEFT ARM  Result Value Ref Range Status   Specimen Description BLOOD LEFT ARM  Final   Special Requests   Final    BOTTLES DRAWN AEROBIC AND ANAEROBIC Blood Culture results may not be optimal due to an inadequate volume of blood received in culture bottles   Culture   Final    NO GROWTH < 12 HOURS Performed at [facility], [address] [place], [place] [address]    Report Status PENDING  Incomplete  Culture, blood (routine x 2) Call MD if unable to obtain prior to antibiotics being given     Status: None (Preliminary result)   Collection Time: 03/28/23 11:14 PM   Specimen: BLOOD LEFT HAND  Result Value Ref Range Status   Specimen Description BLOOD LEFT HAND  Final   Special Requests   Final    BOTTLES DRAWN AEROBIC AND ANAEROBIC Blood Culture results may not be optimal due to an inadequate volume of blood received in culture bottles   Culture   Final    NO GROWTH < 12 HOURS Performed at [facility], [address], [place], [place] [address]    Report Status PENDING  Incomplete    Labs: CBC: Recent Labs  Lab 03/28/23 1417 03/29/23 0343  WBC 12.1* 9.5  HGB 15.0 14.9  HCT 45.5 44.6  MCV 96.2 94.3  PLT 274 274   Basic Metabolic Panel: Recent Labs  Lab 03/28/23 1417 03/29/23 0343  NA 141 136  K 4.2 4.3  CL 104 100  CO2 30 27  GLUCOSE 132* 165*  BUN 16 14  CREATININE 0.93 0.90  CALCIUM 8.7* 8.9   Liver Function Tests: Recent Labs  Lab 03/28/23 1417 03/29/23 0343  AST 19 18  ALT 20 19  ALKPHOS 44 43  BILITOT 0.7 0.5  PROT 5.8* 5.8*  ALBUMIN 3.5 3.4*    Discharge time spent: 35 minutes.  Signed: Jacquelin Hawking, MD [facility] 03/29/2023

## 2023-03-29 NOTE — Progress Notes (Incomplete)
 PROGRESS NOTE    Austin Glass  ZOX:[id number] DOB: May 26, 1955 DOA: 03/28/2023 PCP: Kaleen Mask, MD   Brief Narrative: No notes on file   Assessment and Plan: No notes have been filed under this hospital service. Service: Hospitalist          DVT prophylaxis: *** Code Status:   Code Status: Full Code Family Communication: *** Disposition Plan: ***   Consultants:  ***  Procedures:  ***  Antimicrobials: ***    Subjective: ***  Objective: BP (!) 126/55 (BP Location: Left Arm)   Pulse 71   Temp 98.1 F (36.7 C)   Resp 18   Ht 5\' 9"  (1.753 m)   Wt 77.4 kg   SpO2 95%   BMI 25.20 kg/m   Examination:  General exam: Appears calm and comfortable *** Respiratory system: Clear to auscultation. Respiratory effort normal. Cardiovascular system: S1 & S2 heard, RRR. No murmurs, rubs, gallops or clicks. Gastrointestinal system: Abdomen is nondistended, soft and nontender. No organomegaly or masses felt. Normal bowel sounds heard. Central nervous system: Alert and oriented. No focal neurological deficits. Musculoskeletal: No edema. No calf tenderness Skin: No cyanosis. No rashes Psychiatry: Judgement and insight appear normal. Mood & affect appropriate.    Data Reviewed: I have personally reviewed following labs and imaging studies  CBC Lab Results  Component Value Date   WBC 9.5 03/29/2023   RBC 4.73 03/29/2023   HGB 14.9 03/29/2023   HCT 44.6 03/29/2023   MCV 94.3 03/29/2023   MCH 31.5 03/29/2023   PLT 274 03/29/2023   MCHC 33.4 03/29/2023   RDW 15.0 03/29/2023   LYMPHSABS 846 (L) 12/06/2021   EOSABS 60 12/06/2021   BASOSABS 45 12/06/2021     Last metabolic panel Lab Results  Component Value Date   NA 136 03/29/2023   K 4.3 03/29/2023   CL 100 03/29/2023   CO2 27 03/29/2023   BUN 14 03/29/2023   CREATININE 0.90 03/29/2023   GLUCOSE 165 (H) 03/29/2023   GFRNONAA >60 03/29/2023   CALCIUM 8.9 03/29/2023   PROT 5.8 (L) 03/29/2023    ALBUMIN 3.4 (L) 03/29/2023   BILITOT 0.5 03/29/2023   ALKPHOS 43 03/29/2023   AST 18 03/29/2023   ALT 19 03/29/2023   ANIONGAP 9 03/29/2023    GFR: Estimated Creatinine Clearance: 78.6 mL/min (by C-G formula based on SCr of 0.9 mg/dL).  Recent Results (from the past 240 hours)  Resp panel by RT-PCR (RSV, Flu A&B, Covid) Anterior Nasal Swab     Status: None   Collection Time: 03/28/23  2:17 PM   Specimen: Anterior Nasal Swab  Result Value Ref Range Status   SARS Coronavirus 2 by RT PCR NEGATIVE NEGATIVE Final   Influenza A by PCR NEGATIVE NEGATIVE Final   Influenza B by PCR NEGATIVE NEGATIVE Final    Comment: (NOTE) The Xpert Xpress SARS-CoV-2/FLU/RSV plus assay is intended as an aid in the diagnosis of influenza from Nasopharyngeal swab specimens and should not be used as a sole basis for treatment. Nasal washings and aspirates are unacceptable for Xpert Xpress SARS-CoV-2/FLU/RSV testing.  Fact Sheet for Patients: BloggerCourse.com  Fact Sheet for Healthcare Providers: SeriousBroker.it  This test is not yet approved or cleared by the [place] FDA and has been authorized for detection and/or diagnosis of SARS-CoV-2 by FDA under an Emergency Use Authorization (EUA). This EUA will remain in effect (meaning this test can be used) for the duration of the COVID-19 declaration under Section 564(b)(1) of  the Act, 21 U.S.C. section 360bbb-3(b)(1), unless the authorization is terminated or revoked.     Resp Syncytial Virus by PCR NEGATIVE NEGATIVE Final    Comment: (NOTE) Fact Sheet for Patients: BloggerCourse.com  Fact Sheet for Healthcare Providers: SeriousBroker.it  This test is not yet approved or cleared by the [place] FDA and has been authorized for detection and/or diagnosis of SARS-CoV-2 by FDA under an Emergency Use Authorization (EUA). This EUA will  remain in effect (meaning this test can be used) for the duration of the COVID-19 declaration under Section 564(b)(1) of the Act, 21 U.S.C. section 360bbb-3(b)(1), unless the authorization is terminated or revoked.  Performed at [facility], [address] [place], [place] [address]   Culture, blood (routine x 2) Call MD if unable to obtain prior to antibiotics being given     Status: None (Preliminary result)   Collection Time: 03/28/23 11:14 PM   Specimen: BLOOD LEFT ARM  Result Value Ref Range Status   Specimen Description BLOOD LEFT ARM  Final   Special Requests   Final    BOTTLES DRAWN AEROBIC AND ANAEROBIC Blood Culture results may not be optimal due to an inadequate volume of blood received in culture bottles   Culture   Final    NO GROWTH < 12 HOURS Performed at [facility], [address], [place], [place] [address]    Report Status PENDING  Incomplete  Culture, blood (routine x 2) Call MD if unable to obtain prior to antibiotics being given     Status: None (Preliminary result)   Collection Time: 03/28/23 11:14 PM   Specimen: BLOOD LEFT HAND  Result Value Ref Range Status   Specimen Description BLOOD LEFT HAND  Final   Special Requests   Final    BOTTLES DRAWN AEROBIC AND ANAEROBIC Blood Culture results may not be optimal due to an inadequate volume of blood received in culture bottles   Culture   Final    NO GROWTH < 12 HOURS Performed at [facility], [address] [place], [place] [address]    Report Status PENDING  Incomplete      Radiology Studies: DG Chest 2 View Result Date: 03/28/2023 CLINICAL DATA:  One-month history of worsening shortness of breath EXAM: CHEST - 2 VIEW COMPARISON:  Chest radiograph dated 12/28/2020 FINDINGS: Hyperinflated lungs. Left basilar patchy opacities. No pleural effusion or pneumothorax. The heart size and mediastinal contours are within normal limits. No acute osseous abnormality. IMPRESSION: 1. Hyperinflated  lungs, which can be seen in the setting of COPD. 2. Left basilar patchy opacities, which may represent superimposed infection. Electronically Signed   By: Agustin Cree M.D.   On: 03/28/2023 15:45      LOS: 0 days    Jacquelin Hawking, MD [facility] 03/29/2023, 9:02 AM   If 7PM-7AM, please contact night-coverage [email or web address]

## 2023-03-29 NOTE — Progress Notes (Signed)
SATURATION QUALIFICATIONS: (This note is used to comply with regulatory documentation for home oxygen)  Patient Saturations on Room Air at Rest = 93%  Patient Saturations on Room Air while Ambulating = 91%  Patient Saturations on 0 Liters of oxygen while Ambulating = 91%  Please briefly explain why patient needs home oxygen: 

## 2023-03-29 NOTE — Hospital Course (Signed)
 Austin Glass is a 68 y.o. male with a history of COPD, tobacco use, GERD, insomnia.  Patient presented secondary to shortness of breath and cough with concern for COPD exacerbation with pneumonia. Patient started empirically on antibiotics and steroids with improvement of symptoms. D-dimer obtained and was negative. Patient discharged to continue steroids and antibiotics on discharge.

## 2023-03-29 NOTE — Care Management Obs Status (Signed)
 MEDICARE OBSERVATION STATUS NOTIFICATION   Patient Details  Name: Austin Glass MRN: [id number] Date of Birth: 1955/10/01   Medicare Observation Status Notification Given:  Yes    Ronny Bacon, RN 03/29/2023, 8:34 AM

## 2023-03-29 NOTE — Discharge Instructions (Signed)
 Austin Glass,  You were in the hospital with trouble with your breathing. This appears to be related to pneumonia and a COPD exacerbation. Thankfully, it is not likely that you have a blood clot. I will discharge you with medication. Please follow-up with your PCP.

## 2023-03-30 LAB — LEGIONELLA PNEUMOPHILA SEROGP 1 UR AG: L. pneumophila Serogp 1 Ur Ag: NEGATIVE

## 2023-04-02 LAB — CULTURE, BLOOD (ROUTINE X 2)
Culture: NO GROWTH
Culture: NO GROWTH

## 2023-04-08 ENCOUNTER — Encounter: Payer: Medicare HMO | Admitting: Urology

## 2023-04-11 ENCOUNTER — Other Ambulatory Visit: Payer: Self-pay

## 2023-04-11 ENCOUNTER — Ambulatory Visit

## 2023-04-11 VITALS — BP 132/74 | HR 84 | Ht 67.0 in | Wt 172.0 lb

## 2023-04-11 DIAGNOSIS — I493 Ventricular premature depolarization: Secondary | ICD-10-CM

## 2023-04-11 DIAGNOSIS — R0789 Other chest pain: Secondary | ICD-10-CM | POA: Diagnosis not present

## 2023-04-11 DIAGNOSIS — R0602 Shortness of breath: Secondary | ICD-10-CM | POA: Diagnosis not present

## 2023-04-11 DIAGNOSIS — F419 Anxiety disorder, unspecified: Secondary | ICD-10-CM | POA: Insufficient documentation

## 2023-04-11 DIAGNOSIS — J349 Unspecified disorder of nose and nasal sinuses: Secondary | ICD-10-CM | POA: Insufficient documentation

## 2023-04-11 DIAGNOSIS — J343 Hypertrophy of nasal turbinates: Secondary | ICD-10-CM | POA: Insufficient documentation

## 2023-04-11 DIAGNOSIS — F17200 Nicotine dependence, unspecified, uncomplicated: Secondary | ICD-10-CM | POA: Insufficient documentation

## 2023-04-11 HISTORY — DX: Other chest pain: R07.89

## 2023-04-11 HISTORY — DX: Ventricular premature depolarization: I49.3

## 2023-04-11 MED ORDER — METOPROLOL TARTRATE 100 MG PO TABS: 100.0000 mg | ORAL_TABLET | Freq: Once | ORAL | 0 refills | Status: DC

## 2023-04-11 MED ORDER — ASPIRIN 81 MG PO TBEC: 81.0000 mg | DELAYED_RELEASE_TABLET | Freq: Every day | ORAL | 3 refills | Status: DC

## 2023-04-11 MED ORDER — METOPROLOL TARTRATE 100 MG PO TABS: 100.0000 mg | ORAL_TABLET | Freq: Two times a day (BID) | ORAL | 3 refills | Status: DC

## 2023-04-11 NOTE — Assessment & Plan Note (Signed)
 Symptomatic with sensation of palpitations. 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  Will evaluate transthoracic echocardiogram for cardiac structure and function assessment. Will obtain Zio patch to assess overall burden of PVCs. Evaluation for any underlying coronary artery disease/ischemia with cardiac CT as discussed below.  Discussed with him potential triggers for this. Advised him to avoid smoking.

## 2023-04-11 NOTE — Patient Instructions (Addendum)
 Medication Instructions:  Your physician has recommended you make the following change in your medication:   START: Aspirin 81 mg daily  *If you need a refill on your cardiac medications before your next appointment, please call your pharmacy*   Lab Work: Your physician recommends that you return for lab work in:   Labs today: CBC, Pro BNP, CMP, TSH T3 T4  If you have labs (blood work) drawn today and your tests are completely normal, you will receive your results only by: MyChart Message (if you have MyChart) OR A paper copy in the mail If you have any lab test that is abnormal or we need to change your treatment, we will call you to review the results.   Testing/Procedures: Your physician has requested that you have an echocardiogram. Echocardiography is a painless test that uses sound waves to create images of your heart. It provides your doctor with information about the size and shape of your heart and how well your heart's chambers and valves are working. This procedure takes approximately one hour. There are no restrictions for this procedure. Please do NOT wear cologne, perfume, aftershave, or lotions (deodorant is allowed). Please arrive 15 minutes prior to your appointment time.  Please note: We ask at that you not bring children with you during ultrasound (echo/ vascular) testing. Due to room size and safety concerns, children are not allowed in the ultrasound rooms during exams. Our front office staff cannot provide observation of children in our lobby area while testing is being conducted. An adult accompanying a patient to their appointment will only be allowed in the ultrasound room at the discretion of the ultrasound technician under special circumstances. We apologize for any inconvenience.  A zio monitor was ordered today. It will remain on for 14 days. You will then return monitor and event diary in provided box. It takes 1-2 weeks for report to be downloaded and returned  to [place]. We will call you with the results. If monitor falls off or has orange flashing light, please call Zio for further instructions.     Your cardiac CT will be scheduled at one of the below locations:   [facility] [address] [place], [place] [address] [phone]  OR  [facility] [address] [place], [place] [address] [phone]  OR   [facility] [name] [place], [place] [address] [phone]  OR   [facility] [address] [place], [place] [address] [phone]  If scheduled at [facility], please arrive at the [facility] and Children's Entrance (Entrance C2) of [facility] 30 minutes prior to test start time. You can use the FREE valet parking offered at entrance C (encouraged to control the heart rate for the test)  Proceed to the [facility] Radiology Department (first floor) to check-in and test prep.  All radiology patients and guests should use entrance C2 at [facility], accessed from [facility], even though the hospital's physical address listed is [address].    If scheduled at [facility] or [facility], please arrive 15 mins early for check-in and test prep.  There is spacious parking and easy access to the radiology department from the [facility] Heart and Vascular entrance. Please enter here and check-in with the desk attendant.   If scheduled at [facility], please arrive 30 minutes early for check-in and test  prep.  Please follow these instructions carefully (unless otherwise directed):  An IV will be required for this test and Nitroglycerin will be given.  Hold all erectile dysfunction medications at least 3 days (72 hrs) prior to test. (Ie viagra, cialis, sildenafil, tadalafil, etc)   On the Night Before the Test: Be sure to Drink  plenty of water. Do not consume any caffeinated/decaffeinated beverages or chocolate 12 hours prior to your test. Do not take any antihistamines 12 hours prior to your test.  On the Day of the Test: Drink plenty of water until 1 hour prior to the test. Do not eat any food 1 hour prior to test. You may take your regular medications prior to the test.  Take metoprolol (Lopressor) two hours prior to test. Patients who wear a continuous glucose monitor MUST remove the device prior to scanning.      After the Test: Drink plenty of water. After receiving IV contrast, you may experience a mild flushed feeling. This is normal. On occasion, you may experience a mild rash up to 24 hours after the test. This is not dangerous. If this occurs, you can take Benadryl 25 mg, Zyrtec, Claritin, or Allegra and increase your fluid intake. (Patients taking Tikosyn should avoid Benadryl, and may take Zyrtec, Claritin, or Allegra) If you experience trouble breathing, this can be serious. If it is severe call 911 IMMEDIATELY. If it is mild, please call our office.  We will call to schedule your test 2-4 weeks out understanding that some insurance companies will need an authorization prior to the service being performed.   For more information and frequently asked questions, please visit our website : [email or web address]  For non-scheduling related questions, please contact the cardiac imaging nurse navigator should you have any questions/concerns: Cardiac Imaging Nurse Navigators Direct Office Dial: [phone]   For scheduling needs, including cancellations and rescheduling, please call [place], [phone].    Follow-Up: At [facility], you and your health needs are our priority.  As part of our continuing mission to provide you with exceptional heart care, we have created designated Provider Care Teams.  These Care Teams include your primary Cardiologist (physician) and Advanced  Practice Providers (APPs -  Physician Assistants and Nurse Practitioners) who all work together to provide you with the care you need, when you need it.  We recommend signing up for the patient portal called "MyChart".  Sign up information is provided on this After Visit Summary.  MyChart is used to connect with patients for Virtual Visits (Telemedicine).  Patients are able to view lab/test results, encounter notes, upcoming appointments, etc.  Non-urgent messages can be sent to your provider as well.   To learn more about what you can do with MyChart, go to [email or web address].    Your next appointment:   1 month(s)  Provider:   Huntley Dec, MD    Other Instructions Low salt diet.

## 2023-04-11 NOTE — Assessment & Plan Note (Signed)
 Has cardiovascular risk factors. Atypical chest pain symptoms at times with exertion in association with shortness of breath. Has symptoms of shortness of breath worsening over the past month with worsening pedal edema and frequent PVCs observed.  Proceed further evaluation with cardiac CT coronary angiogram to rule out any significant obstructive disease and for any underlying significant atherosclerosis.  Advised him to avoid any moderate to heavy exertion. Advised him to start taking aspirin 81 mg once daily.

## 2023-04-11 NOTE — Assessment & Plan Note (Addendum)
 Overall multifactorial in the setting of existing history of smoking and COPD.  With bilateral lower extremity edema, cardiovascular risk factors, cannot entirely exclude CHF as contributing factor here.  Will further evaluate for any CHF with repeat blood work for BNP, transthoracic echocardiogram Recommended to reduce salt intake to below 2 g/day and fluid intake to about 2 L/day. Recommended to monitor weights daily

## 2023-04-11 NOTE — Progress Notes (Signed)
 Cardiology Consultation:    Date:  04/11/2023   ID:  Austin Glass, DOB 07/09/1955, MRN [id number]  PCP:  Austin Mask, MD  Cardiologist:  Austin Corporal Lenyx Boody, MD   Referring MD: Austin Glass, *   No chief complaint on file.    ASSESSMENT AND PLAN:   [name] 68 year old male with history of tobacco use, COPD, GERD, insomnia recently admitted  to the hospital and diagnosed with COPD exacerbation and community-acquired pneumonia, presents for further evaluation given ongoing symptoms of shortness of breath, chest pressure, palpitations and bilateral pedal edema.   Problem List Items Addressed This Visit     SOB (shortness of breath) - Primary   Overall multifactorial in the setting of existing history of smoking and COPD.  With bilateral lower extremity edema, cardiovascular risk factors, cannot entirely exclude CHF as contributing factor here.  Will further evaluate for any CHF with repeat blood work for BNP, transthoracic echocardiogram Recommended to reduce salt intake to below 2 g/day and fluid intake to about 2 L/day. Recommended to monitor weights daily      Relevant Orders   EKG 12-Lead (Completed)   ECHOCARDIOGRAM COMPLETE   CT CORONARY MORPH W/CTA COR W/SCORE W/CA W/CM &/OR WO/CM   CBC   Pro b natriuretic peptide (BNP)   TSH+T4F+T3Free   Comp Met (CMET)   Frequent unifocal PVCs   Symptomatic with sensation of palpitations. 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  Will evaluate transthoracic echocardiogram for cardiac structure and function assessment. Will obtain Zio patch to assess overall burden of PVCs. Evaluation for any underlying coronary artery disease/ischemia with cardiac CT as discussed below.  Discussed with him potential triggers for this. Advised him to avoid smoking.       Relevant Medications   aspirin EC 81 MG tablet   Other Relevant Orders   LONG TERM MONITOR (3-14 DAYS)   TSH+T4F+T3Free   Atypical chest pain   Has  cardiovascular risk factors. Atypical chest pain symptoms at times with exertion in association with shortness of breath. Has symptoms of shortness of breath worsening over the past month with worsening pedal edema and frequent PVCs observed.  Proceed further evaluation with cardiac CT coronary angiogram to rule out any significant obstructive disease and for any underlying significant atherosclerosis.  Advised him to avoid any moderate to heavy exertion. Advised him to start taking aspirin 81 mg once daily.        Relevant Orders   EKG 12-Lead (Completed)    Return to clinic tentatively in 1 month.  History of Present Illness:    Austin Glass is a 68 y.o. male who is being seen today for the evaluation of chest pain and bilateral lower extremity edema at the request of Austin Glass, *.   Has a history of COPD, tobacco use [cutting down currently using 2 to 3 cigarettes], GERD, insomnia.  Was recently admitted and discharged from the hospital between 03-28-2023 through 03-29-2023 for COPD exacerbation and community-acquired pneumonia.  High-sensitivity troponin was unremarkable 8, 6.  BNP was normal at 71.2.  Chest x-ray with hyperinflated lungs and left basilar patchy opacity.  Was discharged home on antibiotic regimen. Here for the visit for further evaluation given his ongoing symptoms of shortness of breath associated with sensation of chest tightness and bilateral lower extremity edema which is worsening.  His symptoms of shortness of breath are associated with mild cough and minimal productive sputum.  Denies any fevers or chills.  Does have  frequent sensation of extra beats or skipped beats.  Denies any blood in urine or stools. Denies any syncopal episodes or near syncopal episodes. Denies any noticeable triggers for his palpitations.  Mentions bilateral lower extremity edema had been noticed in the past but has been worse over the past month.  After recent  discharge from the hospital where he had IV access on the right cubital fossa he had superficial vein present to palpation.  Smokes cigarettes, has been cutting down now down to 2 to 3 cigarettes a day. Does not consume alcohol. Denies any illicit drug use.  Prior echocardiogram available to review is from 08/16/2022 with normal LV size, normal LV wall thickness and function with LVEF 71%, mild TR and normal diastolic filling pattern reported.  EKG in the clinic today shows sinus rhythm heart rate 84/min, QRS duration 102 ms incomplete right bundle branch block morphology.  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  Recent blood work from 03-29-2023 while in the hospital hemoglobin 14.9, hematocrit 44.6, platelets 274, WBC 9.5. CMP with BUN 14, creatinine 0.9, EGFR greater than 60.  Normal transaminases and alkaline phosphatase  Past Medical History:  Diagnosis Date   Anxiety    CAP (community acquired pneumonia) 03/28/2023   Continuous dependence on cigarette smoking 03/28/2023   COPD exacerbation (HCC) 03/28/2023   COPD with acute exacerbation (HCC) 03/28/2023   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   GERD (gastroesophageal reflux disease) 03/28/2023   History of hemorrhoids 03/28/2023   Hx of adenomatous colonic polyps 01/04/2016   Hypertrophy of nasal turbinates    Insomnia 03/28/2023   Sinus disease    Smoker    SOB (shortness of breath)     Past Surgical History:  Procedure Laterality Date   CARPAL TUNNEL RELEASE Left    COLONOSCOPY     ETHMOIDECTOMY Bilateral 12/05/2015   Procedure: BILATERAL TOTAL  ETHMOIDECTOMY, MAXILLARY OSTEA  ENLARGEMENT;  Surgeon: Drema Halon, MD;  Location: [facility];  Service: ENT;  Laterality: Bilateral;   EYE SURGERY     cataract   HERNIA REPAIR     UHR   SINUS ENDO WITH FUSION Bilateral 12/05/2015   Procedure: FUCTIONAL  ENDOSCOPIC SINUS SURGERY WITH FUSION NAVIGATION;   Surgeon: Drema Halon, MD;  Location: [facility];  Service: ENT;  Laterality: Bilateral;   TURBINATE REDUCTION Bilateral 12/05/2015   Procedure: BILATERAL TURBINATE REDUCTION;  Surgeon: Drema Halon, MD;  Location: [facility];  Service: ENT;  Laterality: Bilateral;   ULNAR NERVE REPAIR Left    decompression    Current Medications: Current Meds  Medication Sig   albuterol (VENTOLIN HFA) 108 (90 Base) MCG/ACT inhaler Inhale 2 puffs into the lungs every 6 (six) hours as needed for wheezing or shortness of breath (Use when out of your home or if unable to reach your nebulizer machine.).   aspirin EC 81 MG tablet Take 1 tablet (81 mg total) by mouth daily. Swallow whole.   Budeson-Glycopyrrol-Formoterol (BREZTRI AEROSPHERE) 160-9-4.8 MCG/ACT AERO Inhale 2 puffs into the lungs every 12 (twelve) hours.   Calcium & Magnesium Carbonates (MYLANTA PO) Take 1 Dose by mouth daily as needed (stomch).   Cholecalciferol (VITAMIN D-3) 125 MCG (5000 UT) TABS Take 1 tablet by mouth daily.   ibuprofen (ADVIL) 200 MG tablet Take 400-600 mg by mouth every 6 (six) hours as needed for fever, headache, mild pain (pain score 1-3) or moderate pain (pain score  4-6).   ipratropium-albuterol (DUONEB) 0.5-2.5 (3) MG/3ML SOLN Take 3 mLs by nebulization every 4 (four) hours as needed (shortness of breath or wheezing. Use when at home.).   nicotine (NICODERM CQ - DOSED IN MG/24 HOURS) 14 mg/24hr patch Place 1 patch (14 mg total) onto the skin daily.   omeprazole (PRILOSEC) 40 MG capsule Take 1 capsule (40 mg total) by mouth daily before breakfast.   tadalafil (CIALIS) 20 MG tablet Take 20 mg by mouth daily as needed for erectile dysfunction.   traZODone (DESYREL) 150 MG tablet Take 0.5-1 tablets (75-150 mg total) by mouth at bedtime for sleep or mood.   [DISCONTINUED] metoprolol tartrate (LOPRESSOR) 100 MG tablet Take 1 tablet (100 mg total) by mouth 2 (two) times daily.      Allergies:   Penicillins   Social History   Socioeconomic History   Marital status: Divorced    Spouse name: Not on file   Number of children: 1   Years of education: Not on file   Highest education level: Not on file  Occupational History   Occupation: retired  Tobacco Use   Smoking status: Every Day    Current packs/day: 0.20    Types: Cigarettes   Smokeless tobacco: Never   Tobacco comments:    3-4 cigarettes per day 12/31/21  Vaping Use   Vaping status: Never Used  Substance and Sexual Activity   Alcohol use: Not Currently    Comment: once monthly   Drug use: No   Sexual activity: Not on file  Other Topics Concern   Not on file  Social History Narrative   Married, 1 grown child   still smokes a few cigarettes a day   Alcohol once a month no drug use   Social Drivers of Corporate investment banker Strain: Not on file  Food Insecurity: No Food Insecurity (03/29/2023)   Hunger Vital Sign    Worried About Running Out of Food in the Last Year: Never true    Ran Out of Food in the Last Year: Never true  Transportation Needs: No Transportation Needs (03/29/2023)   PRAPARE - Administrator, Civil Service (Medical): No    Lack of Transportation (Non-Medical): No  Physical Activity: Not on file  Stress: Not on file  Social Connections: Moderately Integrated (03/29/2023)   Social Connection and Isolation Panel [NHANES]    Frequency of Communication with Friends and Family: More than three times a week    Frequency of Social Gatherings with Friends and Family: Once a week    Attends Religious Services: More than 4 times per year    Active Member of [employer] or Organizations: No    Attends Engineer, structural: Patient unable to answer    Marital Status: Married     Family History: The patient's family history includes Colon polyps in his father; Ehlers-Danlos syndrome in his sister; Liver cancer in his mother. There is no history of Colon cancer, Stomach  cancer, Rectal cancer, or Pancreatic cancer. ROS:   Please see the history of present illness.    All 14 point review of systems negative except as described per history of present illness.  EKGs/Labs/Other Studies Reviewed:    The following studies were reviewed today:   EKG:  EKG Interpretation Date/Time:  Friday April 11 2023 09:02:55 EDT Ventricular Rate:  84 PR Interval:  152 QRS Duration:  102 QT Interval:  370 QTC Calculation: 437 R Axis:   270  Text  Interpretation: Sinus rhythm with frequent Premature ventricular complexes Left axis deviation Incomplete right bundle branch block Possible Anterior infarct (cited on or before 28-Mar-2023) Abnormal ECG When compared with ECG of 28-Mar-2023 14:18, No significant change was found Confirmed by Huntley Dec reddy [phone]) on 04/11/2023 9:22:45 AM    Recent Labs: 03/28/2023: B Natriuretic Peptide 71.2 03/29/2023: ALT 19; BUN 14; Creatinine, Ser 0.90; Hemoglobin 14.9; Platelets 274; Potassium 4.3; Sodium 136  Recent Lipid Panel No results found for: "CHOL", "TRIG", "HDL", "CHOLHDL", "VLDL", "LDLCALC", "LDLDIRECT"  Physical Exam:    VS:  BP 132/74   Pulse 84   Ht 5\' 7"  (1.702 m)   Wt 172 lb (78 kg)   SpO2 92%   BMI 26.94 kg/m     Wt Readings from Last 3 Encounters:  04/11/23 172 lb (78 kg)  03/28/23 170 lb 10.2 oz (77.4 kg)  10/21/22 170 lb 9.6 oz (77.4 kg)     GENERAL:  Well nourished, well developed in no acute distress NECK: No JVD; No carotid bruits CARDIAC: RRR, S1 and S2 present, no murmurs, no rubs, no gallops CHEST:  Clear to auscultation without rales, wheezing or rhonchi  Extremities: No pitting pedal edema. Pulses bilaterally symmetric with radial 2+ and dorsalis pedis 2+ NEUROLOGIC:  Alert and oriented x 3  Medication Adjustments/Labs and Tests Ordered: Current medicines are reviewed at length with the patient today.  Concerns regarding medicines are outlined above.  Orders Placed This Encounter   Procedures   CT CORONARY MORPH W/CTA COR W/SCORE W/CA W/CM &/OR WO/CM   CBC   Pro b natriuretic peptide (BNP)   TSH+T4F+T3Free   Comp Met (CMET)   LONG TERM MONITOR (3-14 DAYS)   EKG 12-Lead   ECHOCARDIOGRAM COMPLETE   Meds ordered this encounter  Medications   DISCONTD: metoprolol tartrate (LOPRESSOR) 100 MG tablet    Sig: Take 1 tablet (100 mg total) by mouth 2 (two) times daily.    Dispense:  180 tablet    Refill:  3   aspirin EC 81 MG tablet    Sig: Take 1 tablet (81 mg total) by mouth daily. Swallow whole.    Dispense:  90 tablet    Refill:  3    Signed, Vlada Uriostegui reddy Irasema Chalk, MD, MPH, [facility]. 04/11/2023 10:20 AM    [facility]

## 2023-04-11 NOTE — Addendum Note (Signed)
 Addended by: Roxanne Mins I on: 04/11/2023 10:34 AM   Modules accepted: Orders

## 2023-04-12 LAB — CBC
Hematocrit: 47.8 % (ref 37.5–51.0)
Hemoglobin: 15.8 g/dL (ref 13.0–17.7)
MCH: 31.7 pg (ref 26.6–33.0)
MCHC: 33.1 g/dL (ref 31.5–35.7)
MCV: 96 fL (ref 79–97)
Platelets: 245 10*3/uL (ref 150–450)
RBC: 4.98 x10E6/uL (ref 4.14–5.80)
RDW: 13.6 % (ref 11.6–15.4)
WBC: 11 10*3/uL — ABNORMAL HIGH (ref 3.4–10.8)

## 2023-04-12 LAB — COMPREHENSIVE METABOLIC PANEL
ALT: 20 IU/L (ref 0–44)
AST: 18 IU/L (ref 0–40)
Albumin: 4 g/dL (ref 3.9–4.9)
Alkaline Phosphatase: 58 IU/L (ref 44–121)
BUN/Creatinine Ratio: 24 (ref 10–24)
BUN: 21 mg/dL (ref 8–27)
Bilirubin Total: 0.3 mg/dL (ref 0.0–1.2)
CO2: 28 mmol/L (ref 20–29)
Calcium: 9 mg/dL (ref 8.6–10.2)
Chloride: 105 mmol/L (ref 96–106)
Creatinine, Ser: 0.87 mg/dL (ref 0.76–1.27)
Globulin, Total: 1.9 g/dL (ref 1.5–4.5)
Glucose: 97 mg/dL (ref 70–99)
Potassium: 4.6 mmol/L (ref 3.5–5.2)
Sodium: 145 mmol/L — ABNORMAL HIGH (ref 134–144)
Total Protein: 5.9 g/dL — ABNORMAL LOW (ref 6.0–8.5)
eGFR: 93 mL/min/{1.73_m2} (ref >59)

## 2023-04-12 LAB — PRO B NATRIURETIC PEPTIDE: NT-Pro BNP: 125 pg/mL (ref 0–376)

## 2023-04-12 LAB — TSH+T4F+T3FREE
Free T4: 1.43 ng/dL (ref 0.82–1.77)
T3, Free: 3 pg/mL (ref 2.0–4.4)
TSH: 1.24 u[IU]/mL (ref 0.450–4.500)

## 2023-04-15 ENCOUNTER — Other Ambulatory Visit (HOSPITAL_COMMUNITY): Payer: Self-pay

## 2023-04-15 ENCOUNTER — Encounter (HOSPITAL_COMMUNITY): Payer: Self-pay

## 2023-04-16 ENCOUNTER — Ambulatory Visit: Payer: Self-pay | Admitting: Pulmonary Disease

## 2023-04-16 ENCOUNTER — Telehealth: Payer: Self-pay | Admitting: Pulmonary Disease

## 2023-04-16 NOTE — Telephone Encounter (Signed)
 Nurse triage will reach out to pt.

## 2023-04-16 NOTE — Telephone Encounter (Signed)
 Patient is having shortness of breath with oxygen levels between 86 and 88.. Wife is wondering when he should go to the hospital or if he should wait it out. [phone]

## 2023-04-16 NOTE — Telephone Encounter (Signed)
  The patient was prescribed 3 antibiotics for 3 weeks prior to being hospitalized for 1.5 days 2/28/-03/01.  During his hospital stay, he was given IV antibiotics, discharged home with an antibiotic that he took for 1 week.  He has continued to have shortness of breath.  Oxygen saturation has been 86-88% at rest.  He is notably short of breath when he walks very short distances.  His productive cough has increased and he has green sputum.  He has cardiac testing scheduled for tomorrow and does not want to miss his appointment as he has waited a long time for the appointment.  The patient does not wear supplemental oxygen.  Advised the patient to go to the ED for further evaluation.  She stated that she is unsure if he will be willing to go but she will try to convince him.  Routed to pulmonologist for awareness and to further advise.    Reason for Disposition  Oxygen level (e.g., pulse oximetry) 90 percent or lower  Answer Assessment - Initial Assessment Questions 1. RESPIRATORY STATUS: "Describe your breathing?" (e.g., wheezing, shortness of breath, unable to speak, severe coughing)      Short of breath 2. ONSET: "When did this breathing problem begin?"      A few weeks ago and ended up feeling so bad that he went to the hospital.  He was hospitalized 1.5 days 03/01 Pneumonia; IV antibiotics but still had  3. PATTERN "Does the difficult breathing come and go, or has it been constant since it started?"      Only with exertion   4. SEVERITY: "How bad is your breathing?" (e.g., mild, moderate, severe)    - MILD: No SOB at rest, mild SOB with walking, speaks normally in sentences, can lie down, no retractions, pulse < 100.    - MODERATE: SOB at rest, SOB with minimal exertion and prefers to sit, cannot lie down flat, speaks in phrases, mild retractions, audible wheezing, pulse 100-120.    - SEVERE: Very SOB at rest, speaks in single words, struggling to breathe, sitting hunched forward, retractions,  pulse > 120      With exertion severely 5. RECURRENT SYMPTOM: "Have you had difficulty breathing before?" If Yes, ask: "When was the last time?" and "What happened that time?"      Ongoing a few weeks 6. CARDIAC HISTORY: "Do you have any history of heart disease?" (e.g., heart attack, angina, bypass surgery, angioplasty)      Undergoing testing tomorrow  7. LUNG HISTORY: "Do you have any history of lung disease?"  (e.g., pulmonary embolus, asthma, emphysema)     COPD 8. CAUSE: "What do you think is causing the breathing problem?"      Pneumonia -  9. OTHER SYMPTOMS: "Do you have any other symptoms? (e.g., dizziness, runny nose, cough, chest pain, fever)     Can walk from bathroom to the bedroom 10. O2 SATURATION MONITOR:  "Do you use an oxygen saturation monitor (pulse oximeter) at home?" If Yes, ask: "What is your reading (oxygen level) today?" "What is your usual oxygen saturation reading?" (e.g., 95%)       86-88% No supplemental oxygen  When he lays down he is short of breath for a few moments  Protocols used: Breathing Difficulty-A-AH

## 2023-04-16 NOTE — Telephone Encounter (Signed)
 Spoke with patient's wife Zella Ball regarding patient. Patient declined to to go to the ER and is going to go tomorrow after his Cardiologist visit at 2:00pm . Patient stated he waited to long for this office visit . Spoke with the DOD and she advised patient to go to the ED if patient declined risk factors of [name] calling [phone]. [name]'s voice was understanding nothing else further needed.

## 2023-04-17 ENCOUNTER — Other Ambulatory Visit: Payer: Self-pay

## 2023-04-17 ENCOUNTER — Ambulatory Visit (HOSPITAL_COMMUNITY)
Admission: RE | Admit: 2023-04-17 | Discharge: 2023-04-17 | Disposition: A | Discharge status: Home / Self Care | Source: Ambulatory Visit

## 2023-04-17 ENCOUNTER — Other Ambulatory Visit (HOSPITAL_COMMUNITY): Payer: Self-pay

## 2023-04-17 DIAGNOSIS — R0789 Other chest pain: Secondary | ICD-10-CM

## 2023-04-17 DIAGNOSIS — R0602 Shortness of breath: Secondary | ICD-10-CM | POA: Insufficient documentation

## 2023-04-17 DIAGNOSIS — I493 Ventricular premature depolarization: Secondary | ICD-10-CM

## 2023-04-17 MED ORDER — METOPROLOL TARTRATE 5 MG/5ML IV SOLN: 10.0000 mg | Freq: Once | INTRAVENOUS | Status: DC | PRN

## 2023-04-17 MED ORDER — DILTIAZEM HCL 25 MG/5ML IV SOLN
10.0000 mg | INTRAVENOUS | Status: DC | PRN
  Administered 2023-04-17: 5 mg via INTRAVENOUS

## 2023-04-17 MED ORDER — DILTIAZEM HCL 25 MG/5ML IV SOLN
INTRAVENOUS | Status: AC
  Filled 2023-04-17: qty 5

## 2023-04-17 MED ORDER — NITROGLYCERIN 0.4 MG SL SUBL
SUBLINGUAL_TABLET | SUBLINGUAL | Status: AC
Start: 2023-04-17 — End: ?
  Filled 2023-04-17: qty 2

## 2023-04-17 MED ORDER — NITROGLYCERIN 0.4 MG SL SUBL
0.8000 mg | SUBLINGUAL_TABLET | Freq: Once | SUBLINGUAL | Status: AC
  Administered 2023-04-17: 0.8 mg via SUBLINGUAL

## 2023-04-17 NOTE — Progress Notes (Signed)
 Pt's scan was stopped after the calcium scoring due to heart rate.  Pt recently hospitalized for COPD exacerbation caused by pneumonia.  Still having shortness of breath and coughing spasms.  Pt also was having frequent PVC's during his time on the scan table.  Katie(RAD RN) gave 5 mg's of diltiazem which caused the pt's BP to drop to 98/60.  Contacted Dr Anne Fu who decided scan should be aborted at this time.  Fluid bolus given by RN, discharge BP was 108/63.  Pt was agreeable with decision and had no complaints at time of discharge.

## 2023-04-21 ENCOUNTER — Ambulatory Visit: Payer: Medicare HMO | Admitting: Urology

## 2023-04-28 ENCOUNTER — Ambulatory Visit

## 2023-04-28 ENCOUNTER — Ambulatory Visit: Admitting: Urology

## 2023-05-10 ENCOUNTER — Other Ambulatory Visit (HOSPITAL_COMMUNITY): Payer: Self-pay

## 2023-05-12 ENCOUNTER — Ambulatory Visit (HOSPITAL_BASED_OUTPATIENT_CLINIC_OR_DEPARTMENT_OTHER)
Admit: 2023-05-12 | Discharge: 2023-05-12 | Disposition: A | Discharge status: Home / Self Care | Attending: Physician Assistant | Admitting: Physician Assistant

## 2023-05-12 ENCOUNTER — Encounter (HOSPITAL_BASED_OUTPATIENT_CLINIC_OR_DEPARTMENT_OTHER): Payer: Self-pay

## 2023-05-12 ENCOUNTER — Ambulatory Visit (HOSPITAL_BASED_OUTPATIENT_CLINIC_OR_DEPARTMENT_OTHER)
Admission: RE | Admit: 2023-05-12 | Discharge: 2023-05-12 | Disposition: A | Discharge status: Home / Self Care | Source: Ambulatory Visit

## 2023-05-12 VITALS — BP 126/72 | HR 63 | Temp 98.3°F | Resp 20

## 2023-05-12 DIAGNOSIS — M546 Pain in thoracic spine: Secondary | ICD-10-CM

## 2023-05-12 DIAGNOSIS — R091 Pleurisy: Secondary | ICD-10-CM | POA: Diagnosis not present

## 2023-05-12 MED ORDER — LIDOCAINE 5 % EX PTCH: 1.0000 | MEDICATED_PATCH | CUTANEOUS | 0 refills | Status: AC

## 2023-05-12 MED ORDER — PREDNISONE 10 MG (21) PO TBPK
ORAL_TABLET | ORAL | 0 refills | Status: DC
Start: 2023-05-12 — End: 2023-05-19

## 2023-05-12 NOTE — ED Provider Notes (Signed)
 [name]    CSN: [id number] Arrival date & time: 05/12/23  1017      History   Chief Complaint Chief Complaint  Patient presents with   Back Pain    Just released from [facility] on 05/11/23 with dx of fluid in lungs after a chest x-Ray and CT scan. I told them about my back pain and they gave me  ibuprofen's big brother. It didn't work. Need Rx  and don't want to pay another $150 copay. - Entered by patient    HPI Austin Glass is a 68 y.o. male.   Patient presents today with a 4 to 5-day history of right sided thoracic back pain.  He was seen in the emergency room of [facility] yesterday (05/11/2023) due to shortness of breath and back pain.  During his ER evaluation, he had chest x-ray and CTA to rule out PE with negative workup.  He was given ketorolac with some improvement of symptoms but continues to have significant discomfort.  He was started on furosemide to manage swelling and dyspnea which I believe is related to heart failure.  Unfortunately, we are unable to see the records of [facility] in epic.  Pain is worse if he leans back or moves in certain ways and with deep inspiration/cough.  Pain is rated 10 on a 0-10 pain scale, described as sharp, no leaving factors identified.  He has tried over-the-counter analgesics including Tylenol ibuprofen without improvement.  He denies any recent falls or known injury.  He was hospitalized several weeks ago at [facility] for CAP but reports that this is improved.  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  He has not required an increase in his supplemental oxygen since symptoms began.    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   GERD (gastroesophageal reflux disease) 03/28/2023   History of hemorrhoids 03/28/2023   Hx of adenomatous colonic polyps 01/04/2016   Hypertrophy of nasal turbinates    Insomnia 03/28/2023   Sinus disease    Smoker    SOB (shortness of breath)     Patient Active Problem List   Diagnosis Date Noted   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   Atypical chest pain 04/11/2023   Anxiety    Hypertrophy of nasal turbinates    Sinus disease    Smoker    SOB (shortness of breath)    COPD with acute exacerbation (HCC) 03/28/2023   CAP (community acquired pneumonia) 03/28/2023   History of hemorrhoids 03/28/2023   Continuous dependence on cigarette smoking 03/28/2023   GERD (gastroesophageal reflux disease) 03/28/2023   Insomnia 03/28/2023   COPD exacerbation (HCC) 03/28/2023   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   Hx of adenomatous colonic polyps 01/04/2016    Past Surgical History:  Procedure Laterality Date   CARPAL TUNNEL RELEASE Left    COLONOSCOPY     ETHMOIDECTOMY Bilateral 12/05/2015   Procedure: BILATERAL TOTAL  ETHMOIDECTOMY, MAXILLARY OSTEA  ENLARGEMENT;  Surgeon: Drema Halon, MD;  Location: [facility];  Service: ENT;  Laterality: Bilateral;   EYE SURGERY  cataract   HERNIA REPAIR     UHR   SINUS ENDO WITH FUSION Bilateral 12/05/2015   Procedure: FUCTIONAL  ENDOSCOPIC SINUS SURGERY WITH FUSION NAVIGATION;  Surgeon: Drema Halon, MD;  Location: [facility];  Service: ENT;  Laterality: Bilateral;   TURBINATE REDUCTION Bilateral 12/05/2015   Procedure: BILATERAL TURBINATE REDUCTION;  Surgeon: Drema Halon, MD;  Location: [facility];  Service: ENT;  Laterality: Bilateral;   ULNAR NERVE REPAIR Left    decompression       Home Medications    Prior to Admission medications   Medication  Sig Start Date End Date Taking? Authorizing Provider  azithromycin (ZITHROMAX) 250 MG tablet Take 250 mg by mouth daily. 05/06/23  Yes [provider]  furosemide (LASIX) 80 MG tablet Take 40-80 mg by mouth daily as needed. 04/14/23  Yes [provider]  lidocaine (LIDODERM) 5 % Place 1 patch onto the skin daily for 10 days. Remove & Discard patch within 12 hours or as directed by MD 05/12/23 05/22/23 Yes Alizea Pell, Noberto Retort, PA-C  linezolid (ZYVOX) 600 MG tablet Take 600 mg by mouth 2 (two) times daily. 04/25/23  Yes [provider]  pantoprazole (PROTONIX) 40 MG tablet Take 40 mg by mouth daily. 04/28/23  Yes [provider]  predniSONE (STERAPRED UNI-PAK 21 TAB) 10 MG (21) TBPK tablet As directed 05/12/23  Yes Birt Reinoso K, PA-C  TRELEGY ELLIPTA 100-62.5-25 MCG/ACT AEPB Take 1 puff by mouth daily. 05/06/23  Yes [provider]  albuterol (VENTOLIN HFA) 108 (90 Base) MCG/ACT inhaler Inhale 2 puffs into the lungs every 6 (six) hours as needed for wheezing or shortness of breath (Use when out of your home or if unable to reach your nebulizer machine.). 03/29/23   Narda Bonds, MD  aspirin EC 81 MG tablet Take 1 tablet (81 mg total) by mouth daily. Swallow whole. 04/11/23   Madireddy, Marlyn Corporal, MD  Budeson-Glycopyrrol-Formoterol (BREZTRI AEROSPHERE) 160-9-4.8 MCG/ACT AERO Inhale 2 puffs into the lungs every 12 (twelve) hours. 07/22/22     Calcium & Magnesium Carbonates (MYLANTA PO) Take 1 Dose by mouth daily as needed (stomch).    [provider]  Cholecalciferol (VITAMIN D-3) 125 MCG (5000 UT) TABS Take 1 tablet by mouth daily.    [provider]  ipratropium-albuterol (DUONEB) 0.5-2.5 (3) MG/3ML SOLN Take 3 mLs by nebulization every 4 (four) hours as needed (shortness of breath or wheezing. Use when at home.). 03/29/23   Narda Bonds, MD  metoprolol tartrate (LOPRESSOR) 100 MG tablet Take 1 tablet (100 mg total) by mouth once for 1 dose. Please take  this medication 2 hours before CT. 04/11/23 04/11/23  Madireddy, Marlyn Corporal, MD  nicotine (NICODERM CQ - DOSED IN MG/24 HOURS) 14 mg/24hr patch Place 1 patch (14 mg total) onto the skin daily. 10/21/22   Chilton Greathouse, MD  omeprazole (PRILOSEC) 40 MG capsule Take 1 capsule (40 mg total) by mouth daily before breakfast. 12/12/22   Iva Boop, MD  tadalafil (CIALIS) 20 MG tablet Take 20 mg by mouth daily as needed for erectile dysfunction. 10/31/21   [provider]  traZODone (DESYREL) 150 MG tablet Take 0.5-1 tablets (75-150 mg total) by mouth at bedtime for sleep or mood. 12/05/22       Family History Family History  Problem Relation Age of Onset   Liver cancer Mother    Colon polyps Father    Ehlers-Danlos syndrome Sister  Colon cancer Neg Hx    Stomach cancer Neg Hx    Rectal cancer Neg Hx    Pancreatic cancer Neg Hx     Social History Social History   Tobacco Use   Smoking status: Every Day    Current packs/day: 0.20    Types: Cigarettes   Smokeless tobacco: Never   Tobacco comments:    3-4 cigarettes per day 12/31/21  Vaping Use   Vaping status: Never Used  Substance Use Topics   Alcohol use: Not Currently    Comment: once monthly   Drug use: No     Allergies   Penicillins   Review of Systems Review of Systems  Constitutional:  Positive for activity change. Negative for appetite change, fatigue and fever.  Respiratory:  Positive for cough and shortness of breath.   Cardiovascular:  Negative for chest pain.  Gastrointestinal:  Negative for abdominal pain, diarrhea, nausea and vomiting.  Musculoskeletal:  Positive for back pain. Negative for arthralgias and myalgias.  Skin:  Negative for rash.     Physical Exam Triage Vital Signs ED Triage Vitals  Encounter Vitals Group     BP 05/12/23 1028 107/71     Systolic BP Percentile --      Diastolic BP Percentile --      Pulse Rate 05/12/23 1028 75     Resp 05/12/23 1028 (!) 23     Temp 05/12/23  1028 98.3 F (36.8 C)     Temp Source 05/12/23 1028 Oral     SpO2 05/12/23 1028 91 %     Weight --      Height --      Head Circumference --      Peak Flow --      Pain Score 05/12/23 1026 10     Pain Loc --      Pain Education --      Exclude from Growth Chart --    No data found.  Updated Vital Signs BP 126/72 (BP Location: Right Arm)   Pulse 63   Temp 98.3 F (36.8 C) (Oral)   Resp 20   SpO2 94%   Visual Acuity Right Eye Distance:   Left Eye Distance:   Bilateral Distance:    Right Eye Near:   Left Eye Near:    Bilateral Near:     Physical Exam Vitals reviewed.  Constitutional:      General: He is awake.     Appearance: Normal appearance. He is well-developed. He is ill-appearing.     Interventions: Nasal cannula in place.     Comments: Very pleasant male appears stated age no acute distress sitting comfortably in exam room chronically ill-appearing with supplemental oxygen via nasal cannula  HENT:     Head: Normocephalic and atraumatic.     Mouth/Throat:     Pharynx: Uvula midline. No oropharyngeal exudate or posterior oropharyngeal erythema.  Cardiovascular:     Rate and Rhythm: Normal rate and regular rhythm.     Heart sounds: Normal heart sounds, S1 normal and S2 normal. No murmur heard. Pulmonary:     Effort: Pulmonary effort is normal.     Breath sounds: No stridor. Examination of the right-lower field reveals decreased breath sounds. Examination of the left-lower field reveals decreased breath sounds. Decreased breath sounds present. No wheezing, rhonchi or rales.     Comments: Decreased aeration bilateral bases without adventitious lung sounds. Chest:     Chest wall: No deformity, swelling or tenderness.  Abdominal:  Palpations: Abdomen is soft.     Tenderness: There is no abdominal tenderness.  Musculoskeletal:     Cervical back: No tenderness or bony tenderness.     Thoracic back: Bony tenderness present. No tenderness.     Lumbar back: No  tenderness or bony tenderness.       Back:     Right lower leg: 3+ Edema present.     Left lower leg: 3+ Edema present.  Skin:    Findings: No rash. Rash is not vesicular.  Neurological:     Mental Status: He is alert.  Psychiatric:        Behavior: Behavior is cooperative.      UC Treatments / Results  Labs (all labs ordered are listed, but only abnormal results are displayed) Labs Reviewed - No data to display  EKG   Radiology DG Ribs Unilateral W/Chest Right Result Date: 05/12/2023 CLINICAL DATA:  Thoracic back pain.  Mid back pain for 4-5 days. EXAM: RIGHT RIBS AND CHEST - 3+ VIEW COMPARISON:  Chest radiographs 05/11/2023 and 04/20/2023. Chest CT 05/11/2023. FINDINGS: The heart size and mediastinal contours are normal. The lungs are clear. There is no pleural effusion or pneumothorax. No evidence of acute right-sided rib fracture or focal rib lesion. Metallic BB placed over the area of pain is near the right 12th costovertebral junction. IMPRESSION: 1. No evidence of acute right-sided rib fracture or focal rib lesion. Thoracic spine findings dictated separately. 2. No acute cardiopulmonary process. Electronically Signed   By: Elmon Hagedorn M.D.   On: 05/12/2023 12:05   DG Thoracic Spine 2 View Result Date: 05/12/2023 CLINICAL DATA:  Mid back pain for the past 4-5 days. No reported injury. EXAM: THORACIC SPINE 2 VIEWS COMPARISON:  Chest radiographs dated 03/28/2023 FINDINGS: The bones appear diffusely osteopenic. Mild anterior spur formation at multiple levels of the thoracic spine. The thoracolumbar junction is not included on the lateral view. No fractures or subluxations seen. Mild-to-moderate lower cervical spine degenerative changes. Stable changes of COPD and chronic bronchitis. IMPRESSION: 1. No fracture or subluxation. 2. Mild degenerative changes. 3. Osteopenia. 4. Stable changes of COPD and chronic bronchitis. Electronically Signed   By: Catherin Closs M.D.   On: 05/12/2023  12:00    Procedures Procedures (including critical care time)  Medications Ordered in UC Medications - No data to display  Initial Impression / Assessment and Plan / UC Course  I have reviewed the triage vital signs and the nursing notes.  Pertinent labs & imaging results that were available during my care of the patient were reviewed by me and considered in my medical decision making (see chart for details).     Patient has well-appearing, afebrile, nontoxic, nontachycardic.  He was initially mildly tachypneic but improved after he sat quietly for several minutes as that his oxygen saturation.  He had extensive workup at [facility] including rule out of PE yesterday.  X-ray of ribs/chest and thoracic spine were obtained that showed degenerative changes in thoracic spine without acute abnormality.  Given his clinical presentation I am concerned for pleurisy likely related to his recent pneumonia.  Will start prednisone as this will hopefully help with both his breathing and discomfort.  He denies any history of diabetes.  He was instructed to take NSAIDs with this medication the risk of GI bleeding but can use Tylenol for breakthrough pain.  He was given lidocaine patches for additional symptom relief we discussed that he should apply this for 12 hours and  then remove it for 12 hours using only 1 patch per 24 hours.  Recommend close follow-up with his primary care.  We discussed that if anything changes and he has increasing pain, worsening cough, fever, increased oxygen requirement, shortness of breath he is to be seen emergently.  Strict return precautions given.  All questions answered to patient satisfaction.  He expressed agreement and understanding of treatment plan.  Final Clinical Impressions(s) / UC Diagnoses   Final diagnoses:  Acute right-sided thoracic back pain  Pleurisy     Discharge Instructions      Your x-rays did not show anything broken or out of place.  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  As we discussed, I am concerned about inflammation of the lining of your lungs.  Start prednisone as prescribed.  Do not take NSAIDs with this medication including aspirin, ibuprofen/Advil, naproxen/Aleve.  You can use Tylenol as needed for breakthrough pain.  Apply lidocaine patches to the specific area that is bothering you during the day.  Remove this at night.  Use only 1 patch per 24 hours.  Follow-up with your primary care next week.  If anything changes and you have increasing pain, shortness of breath, nausea/vomiting, weakness, increased oxygen requirement you need to go to the emergency room immediately.     ED Prescriptions     Medication Sig Dispense Auth. Provider   lidocaine (LIDODERM) 5 % Place 1 patch onto the skin daily for 10 days. Remove & Discard patch within 12 hours or as directed by MD 10 patch Ethelreda Sukhu K, PA-C   predniSONE (STERAPRED UNI-PAK 21 TAB) 10 MG (21) TBPK tablet As directed 21 tablet Luria Rosario K, PA-C      PDMP not reviewed this encounter.   Budd Cargo, PA-C 05/12/23 1226

## 2023-05-12 NOTE — Discharge Instructions (Signed)
 Your x-rays did not show anything broken or out of place.  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  As we discussed, I am concerned about inflammation of the lining of your lungs.  Start prednisone as prescribed.  Do not take NSAIDs with this medication including aspirin, ibuprofen/Advil, naproxen/Aleve.  You can use Tylenol as needed for breakthrough pain.  Apply lidocaine patches to the specific area that is bothering you during the day.  Remove this at night.  Use only 1 patch per 24 hours.  Follow-up with your primary care next week.  If anything changes and you have increasing pain, shortness of breath, nausea/vomiting, weakness, increased oxygen requirement you need to go to the emergency room immediately.

## 2023-05-12 NOTE — ED Triage Notes (Signed)
 Pt reports had mid back pain for 4-5 days. Was in hospital yesterday and was given "ibuprofen's big but didn't help". Reports pain making hard to breathe.

## 2023-05-19 ENCOUNTER — Ambulatory Visit

## 2023-05-19 VITALS — BP 134/72 | HR 60 | Ht 67.0 in | Wt 179.2 lb

## 2023-05-19 DIAGNOSIS — R0602 Shortness of breath: Secondary | ICD-10-CM

## 2023-05-19 DIAGNOSIS — I493 Ventricular premature depolarization: Secondary | ICD-10-CM

## 2023-05-19 DIAGNOSIS — R0789 Other chest pain: Secondary | ICD-10-CM

## 2023-05-19 MED ORDER — ASPIRIN 81 MG PO TBEC: 81.0000 mg | DELAYED_RELEASE_TABLET | Freq: Every day | ORAL | Status: DC

## 2023-05-19 NOTE — Assessment & Plan Note (Addendum)
 Cardiac CT previously scheduled in March was incomplete and only calcium score available, calcium score 14.2, study incomplete due to his ongoing shortness of breath and frequent ventricular ectopy. Cardiac CT imaging currently scheduled and pending for April 28.  Will review the results once available. Advised him to continue taking aspirin  81 mg once daily.

## 2023-05-19 NOTE — Progress Notes (Signed)
 Cardiology Consultation:    Date:  05/19/2023   ID:  Austin Glass, DOB [birth year], MRN [id number]  PCP:  Austin Chamorro, MD  Cardiologist:  Austin Evans Therman Hughlett, MD   Referring MD: Austin Glass, *   No chief complaint on file.    ASSESSMENT AND PLAN:   Austin Glass 68 year old male  history of tobacco use, COPD, GERD, insomnia recently admitted to the hospital and diagnosed with COPD exacerbation and community-acquired pneumonia and has had multiple visits to the ER again in March and April.  Echocardiogram March 2025 at [facility] with normal biventricular function EF 65 to 70%, no significant valve abnormalities, diastolic function was indeterminate.  Event monitor 14-day study from March 2025 showing predominantly sinus rhythm and 6.6% supraventricular ectopy burden with longest run 19 beat duration.  Ventricular ectopy burden less than 1%.  Symptoms correlated mostly with sinus rhythm and at times with isolated ventricular and supraventricular ectopic beats. Cardiac CT coronary imaging is currently pending for April 28. Patient is symptoms remain to be in mostly related to back pain which is musculoskeletal and shortness of breath which appear predominantly pulmonary causes related.  Problem List Items Addressed This Visit     SOB (shortness of breath)   Multifactorial with underlying pulmonary issues with history of COPD, chronic smoking history.  Has bilateral lower extremity edema however proBNP has been normal.  Advised to keep salt intake to below 2 g/day. Advised to continue with furosemide 80 mg once daily.  Will review cardiac CT image results once available to assess for coronary artery disease. Advised to continue following up with pulmonologist for further optimization of his therapy given his recent CT chest findings.      Frequent unifocal PVCs - Primary   Rare ventricular ectopy burden on event monitor less than 1%. No further treatment  required at this time in the setting of normal biventricular function with LVEF 65 to 70%.       Relevant Medications   aspirin  EC 81 MG tablet   Atypical chest pain   Cardiac CT previously scheduled in March was incomplete and only calcium score available, calcium score 14.2, study incomplete due to his ongoing shortness of breath and frequent ventricular ectopy. Cardiac CT imaging currently scheduled and pending for April 28.  Will review the results once available. Advised him to continue taking aspirin  81 mg once daily.       Return to clinic tentative in 3 months, earlier follow-up based on test results.   History of Present Illness:    Austin Glass is a 68 y.o. male who is being seen today for follow-up visit. PCP is Austin Glass, *. Last visit with me in the office was 04-11-2023  history of tobacco use, COPD, GERD, insomnia recently admitted to the hospital and diagnosed with COPD exacerbation and community-acquired pneumonia and has had multiple visits to the ER again in March and April.  Was seen by us  for further evaluation given ongoing symptoms of shortness of breath, chest pressure, palpitations and bilateral pedal edema.  He has had echocardiogram and heart monitor was requested at last office visit however still pending with the cardiac CT coronary imaging that was requested as only calcium score was completed in March 2025 [shortness of breath and frequent PVCs limited ability to complete the study]; currently rescheduled for April 28.  He has also had visits to the ER at [facility] 04-20-2023 and to the ER at [facility] 05-12-2023  Echocardiogram from [facility] 04-21-2023 noted normal biventricular function LVEF 65 to 70%, indeterminate diastolic function, trace MR and TR.  14-day heart monitor from 04-11-2023 noted predominantly sinus rhythm with average heart rate 85/min [ranging from 47 to 128 bpm].  Supraventricular ectopy burden  6.6%.  Ventricular ectopy burden rare, less than 1%.  7 instances of supraventricular tachycardia noted with the longest episode lasting 19 beats.  Total patient triggered events 7 mostly correlated with sinus rhythm and at times with isolated ventricular and supraventricular ectopic beats.  CT angiogram of the chest from 04-20-2023 at [facility] for possible reported patchy tree-in-bud reticulonodular densities in the right lower lobe.  CTA chest at [facility] 05-28-2023 noted no pulmonary embolism.  Overall mentions significant in terms due to upper back pain that is worse with movement and deep breathing. Also has bilateral lower extremity edema which he mentions has been steady. Denies any pain in bilateral calfs.  Denies any chest pain at this time.  Denies any significant palpitations.  Has cardiac CT coronary angiogram pending April 28.  Not strictly adherent with low-sodium diet at home.  Blood work from 05-18-2023 BUN 21, creatinine 0.6, EGFR 104 Sodium 142, potassium 4.5 Hemoglobin 13.5, hematocrit 40.6 proBNP 32   Past Medical History:  Diagnosis Date   Anxiety    Atypical chest pain 04/11/2023   CAP (community acquired pneumonia) 03/28/2023   Continuous dependence on cigarette smoking 03/28/2023   COPD exacerbation (HCC) 03/28/2023   COPD with acute exacerbation (HCC) 03/28/2023   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   GERD (gastroesophageal reflux disease) 03/28/2023   History of hemorrhoids 03/28/2023   Hx of adenomatous colonic polyps 01/04/2016   Hypertrophy of nasal turbinates    Insomnia 03/28/2023   Sinus disease    Smoker    SOB (shortness of breath)     Past Surgical History:  Procedure Laterality Date   CARPAL TUNNEL RELEASE Left    COLONOSCOPY     ETHMOIDECTOMY Bilateral 12/05/2015   Procedure: BILATERAL TOTAL  ETHMOIDECTOMY, MAXILLARY OSTEA  ENLARGEMENT;  Surgeon: Austin Brodie, MD;  Location: [facility];  Service: ENT;  Laterality: Bilateral;   EYE SURGERY     cataract   HERNIA REPAIR     UHR   SINUS ENDO WITH FUSION Bilateral 12/05/2015   Procedure: FUCTIONAL  ENDOSCOPIC SINUS SURGERY WITH FUSION NAVIGATION;  Surgeon: Austin Brodie, MD;  Location: [facility];  Service: ENT;  Laterality: Bilateral;   TURBINATE REDUCTION Bilateral 12/05/2015   Procedure: BILATERAL TURBINATE REDUCTION;  Surgeon: Austin Brodie, MD;  Location: [facility];  Service: ENT;  Laterality: Bilateral;   ULNAR NERVE REPAIR Left    decompression    Current Medications: Current Meds  Medication Sig   albuterol  (VENTOLIN  HFA) 108 (90 Base) MCG/ACT inhaler Inhale 2 puffs into the lungs every 6 (six) hours as needed for wheezing or shortness of breath (Use when out of your home or if unable to reach your nebulizer machine.).   aspirin  EC 81 MG tablet Take 1 tablet (81 mg total) by mouth daily. Swallow whole.   azithromycin (ZITHROMAX) 250 MG tablet Take 250 mg by mouth daily.   Budeson-Glycopyrrol-Formoterol  (BREZTRI  AEROSPHERE) 160-9-4.8 MCG/ACT AERO Inhale 2 puffs into the lungs every 12 (twelve) hours.   furosemide (LASIX) 80 MG tablet Take 40-80 mg by mouth daily as needed.   ipratropium-albuterol  (DUONEB)  0.5-2.5 (3) MG/3ML SOLN Take 3 mLs by nebulization every 4 (four) hours as needed (shortness of breath or wheezing. Use when at home.).   lidocaine  (LIDODERM ) 5 % Place 1 patch onto the skin daily for 10 days. Remove & Discard patch within 12 hours or as directed by MD   pantoprazole  (PROTONIX ) 40 MG tablet Take 40 mg by mouth daily.   traZODone  (DESYREL ) 150 MG tablet Take 0.5-1 tablets (75-150 mg total) by mouth at bedtime for sleep or mood.   [DISCONTINUED] TRELEGY ELLIPTA 100-62.5-25 MCG/ACT AEPB Take 1 puff by mouth daily.     Allergies:   Penicillins   Social History   Socioeconomic History   Marital status:  Divorced    Spouse name: Not on file   Number of children: 1   Years of education: Not on file   Highest education level: Not on file  Occupational History   Occupation: retired  Tobacco Use   Smoking status: Every Day    Current packs/day: 0.20    Types: Cigarettes   Smokeless tobacco: Never   Tobacco comments:    3-4 cigarettes per day 12/31/21  Vaping Use   Vaping status: Never Used  Substance and Sexual Activity   Alcohol use: Not Currently    Comment: once monthly   Drug use: No   Sexual activity: Not on file  Other Topics Concern   Not on file  Social History Narrative   Married, 1 grown child   still smokes a few cigarettes a day   Alcohol once a month no drug use   Social Drivers of Corporate investment banker Strain: Not on file  Food Insecurity: No Food Insecurity (03/29/2023)   Hunger Vital Sign    Worried About Running Out of Food in the Last Year: Never true    Ran Out of Food in the Last Year: Never true  Transportation Needs: No Transportation Needs (03/29/2023)   PRAPARE - Administrator, Civil Service (Medical): No    Lack of Transportation (Non-Medical): No  Physical Activity: Not on file  Stress: Not on file  Social Connections: Moderately Integrated (03/29/2023)   Social Connection and Isolation Panel [NHANES]    Frequency of Communication with Friends and Family: More than three times a week    Frequency of Social Gatherings with Friends and Family: Once a week    Attends Religious Services: More than 4 times per year    Active Member of [employer] or Organizations: No    Attends Engineer, structural: Patient unable to answer    Marital Status: Married     Family History: The patient's family history includes Colon polyps in his father; Ehlers-Danlos syndrome in his sister; Liver cancer in his mother. There is no history of Colon cancer, Stomach cancer, Rectal cancer, or Pancreatic cancer. ROS:   Please see the history of present  illness.    All 14 point review of systems negative except as described per history of present illness.  EKGs/Labs/Other Studies Reviewed:    The following studies were reviewed today:   EKG:       Recent Labs: 03/28/2023: B Natriuretic Peptide 71.2 04/11/2023: ALT 20; BUN 21; Creatinine, Ser 0.87; Hemoglobin 15.8; NT-Pro BNP 125; Platelets 245; Potassium 4.6; Sodium 145; TSH 1.240  Recent Lipid Panel No results found for: "CHOL", "TRIG", "HDL", "CHOLHDL", "VLDL", "LDLCALC", "LDLDIRECT"  Physical Exam:    VS:  BP 134/72   Pulse 60   Ht 5'  7" (1.702 m)   Wt 179 lb 3.2 oz (81.3 kg)   SpO2 91%   BMI 28.07 kg/m     Wt Readings from Last 3 Encounters:  05/19/23 179 lb 3.2 oz (81.3 kg)  04/11/23 172 lb (78 kg)  03/28/23 170 lb 10.2 oz (77.4 kg)     GENERAL:  Well nourished, well developed in no acute distress NECK: No JVD; No carotid bruits CARDIAC: RRR, S1 and S2 present, no murmurs, no rubs, no gallops CHEST:  Clear to auscultation without rales, wheezing or rhonchi  Extremities: Bilateral 1+ pitting pedal edema up to the knees. Pulses bilaterally symmetric with radial 2+ and dorsalis pedis 2+ NEUROLOGIC:  Alert and oriented x 3  Medication Adjustments/Labs and Tests Ordered: Current medicines are reviewed at length with the patient today.  Concerns regarding medicines are outlined above.  No orders of the defined types were placed in this encounter.  Meds ordered this encounter  Medications   aspirin  EC 81 MG tablet    Sig: Take 1 tablet (81 mg total) by mouth daily. Swallow whole.    Signed, Lura Sallies, MD, MPH, [facility]. 05/19/2023 12:25 PM    [facility]

## 2023-05-19 NOTE — Assessment & Plan Note (Signed)
 Rare ventricular ectopy burden on event monitor less than 1%. No further treatment required at this time in the setting of normal biventricular function with LVEF 65 to 70%.

## 2023-05-19 NOTE — Assessment & Plan Note (Signed)
 Multifactorial with underlying pulmonary issues with history of COPD, chronic smoking history.  Has bilateral lower extremity edema however proBNP has been normal.  Advised to keep salt intake to below 2 g/day. Advised to continue with furosemide 80 mg once daily.  Will review cardiac CT image results once available to assess for coronary artery disease. Advised to continue following up with pulmonologist for further optimization of his therapy given his recent CT chest findings.

## 2023-05-19 NOTE — Patient Instructions (Signed)
 Medication Instructions:  Take Aspirin  81 mg once a day  *If you need a refill on your cardiac medications before your next appointment, please call your pharmacy*   Lab Work: None Ordered If you have labs (blood work) drawn today and your tests are completely normal, you will receive your results only by: MyChart Message (if you have MyChart) OR A paper copy in the mail If you have any lab test that is abnormal or we need to change your treatment, we will call you to review the results.   Testing/Procedures: None Ordered   Follow-Up: At [facility], you and your health needs are our priority.  As part of our continuing mission to provide you with exceptional heart care, we have created designated Provider Care Teams.  These Care Teams include your primary Cardiologist (physician) and Advanced Practice Providers (APPs -  Physician Assistants and Nurse Practitioners) who all work together to provide you with the care you need, when you need it.  We recommend signing up for the patient portal called "MyChart".  Sign up information is provided on this After Visit Summary.  MyChart is used to connect with patients for Virtual Visits (Telemedicine).  Patients are able to view lab/test results, encounter notes, upcoming appointments, etc.  Non-urgent messages can be sent to your provider as well.   To learn more about what you can do with MyChart, go to [email or web address].    Your next appointment:   3 month follow up

## 2023-05-20 ENCOUNTER — Ambulatory Visit: Payer: PPO | Admitting: Podiatry

## 2023-05-22 ENCOUNTER — Encounter (HOSPITAL_COMMUNITY): Payer: Self-pay

## 2023-05-26 ENCOUNTER — Ambulatory Visit (HOSPITAL_COMMUNITY)
Admission: RE | Admit: 2023-05-26 | Discharge: 2023-05-26 | Disposition: A | Discharge status: Home / Self Care | Source: Ambulatory Visit

## 2023-05-26 DIAGNOSIS — I2510 Atherosclerotic heart disease of native coronary artery without angina pectoris: Secondary | ICD-10-CM | POA: Insufficient documentation

## 2023-05-26 DIAGNOSIS — R079 Chest pain, unspecified: Secondary | ICD-10-CM | POA: Insufficient documentation

## 2023-05-26 DIAGNOSIS — R0789 Other chest pain: Secondary | ICD-10-CM

## 2023-05-26 MED ORDER — NITROGLYCERIN 0.4 MG SL SUBL
0.8000 mg | SUBLINGUAL_TABLET | Freq: Once | SUBLINGUAL | Status: AC
  Administered 2023-05-26: 0.8 mg via SUBLINGUAL

## 2023-05-26 MED ORDER — IOHEXOL 350 MG/ML SOLN
100.0000 mL | Freq: Once | INTRAVENOUS | Status: AC | PRN
  Administered 2023-05-26: 100 mL via INTRAVENOUS

## 2023-05-26 MED ORDER — NITROGLYCERIN 0.4 MG SL SUBL
SUBLINGUAL_TABLET | SUBLINGUAL | Status: AC
Start: 2023-05-26 — End: ?
  Filled 2023-05-26: qty 2

## 2023-06-02 ENCOUNTER — Ambulatory Visit: Admitting: Pulmonary Disease

## 2023-06-02 ENCOUNTER — Encounter: Payer: Self-pay | Admitting: Pulmonary Disease

## 2023-06-02 ENCOUNTER — Other Ambulatory Visit (INDEPENDENT_AMBULATORY_CARE_PROVIDER_SITE_OTHER)

## 2023-06-02 VITALS — BP 118/60 | Ht 67.0 in | Wt 180.2 lb

## 2023-06-02 DIAGNOSIS — R0600 Dyspnea, unspecified: Secondary | ICD-10-CM

## 2023-06-02 DIAGNOSIS — J439 Emphysema, unspecified: Secondary | ICD-10-CM

## 2023-06-02 DIAGNOSIS — I890 Lymphedema, not elsewhere classified: Secondary | ICD-10-CM

## 2023-06-02 DIAGNOSIS — J4489 Other specified chronic obstructive pulmonary disease: Secondary | ICD-10-CM

## 2023-06-02 DIAGNOSIS — M549 Dorsalgia, unspecified: Secondary | ICD-10-CM | POA: Diagnosis not present

## 2023-06-02 DIAGNOSIS — Z87891 Personal history of nicotine dependence: Secondary | ICD-10-CM

## 2023-06-02 DIAGNOSIS — J441 Chronic obstructive pulmonary disease with (acute) exacerbation: Secondary | ICD-10-CM | POA: Diagnosis not present

## 2023-06-02 LAB — CBC WITH DIFFERENTIAL/PLATELET
Basophils Absolute: 0.1 10*3/uL (ref 0.0–0.1)
Basophils Relative: 1 % (ref 0.0–3.0)
Eosinophils Absolute: 0.1 10*3/uL (ref 0.0–0.7)
Eosinophils Relative: 1.3 % (ref 0.0–5.0)
HCT: 42.7 % (ref 39.0–52.0)
Hemoglobin: 14.1 g/dL (ref 13.0–17.0)
Lymphocytes Relative: 15.7 % (ref 12.0–46.0)
Lymphs Abs: 1.5 10*3/uL (ref 0.7–4.0)
MCHC: 33 g/dL (ref 30.0–36.0)
MCV: 95 fl (ref 78.0–100.0)
Monocytes Absolute: 0.9 10*3/uL (ref 0.1–1.0)
Monocytes Relative: 9.8 % (ref 3.0–12.0)
Neutro Abs: 6.9 10*3/uL (ref 1.4–7.7)
Neutrophils Relative %: 72.2 % (ref 43.0–77.0)
Platelets: 331 10*3/uL (ref 150.0–400.0)
RBC: 4.5 Mil/uL (ref 4.22–5.81)
RDW: 15.2 % (ref 11.5–15.5)
WBC: 9.6 10*3/uL (ref 4.0–10.5)

## 2023-06-02 MED ORDER — AZITHROMYCIN 250 MG PO TABS: 250.0000 mg | ORAL_TABLET | Freq: Every day | ORAL | 3 refills | Status: DC

## 2023-06-02 NOTE — Addendum Note (Signed)
 Addended by: Luana Rumple R on: 06/02/2023 11:09 AM   Modules accepted: Orders

## 2023-06-02 NOTE — Patient Instructions (Signed)
 VISIT SUMMARY:  During your visit, we discussed your persistent shortness of breath, back pain, and leg swelling. We reviewed your recent hospitalizations and current medications. We also talked about your smoking cessation and its importance for your health.  YOUR PLAN:  -SEVERE COPD WITH EXACERBATION: Chronic Obstructive Pulmonary Disease (COPD) is a long-term lung condition that makes it hard to breathe. Your COPD has worsened recently, leading to multiple hospitalizations. You should continue using Breztri  and albuterol . We will order blood tests to check for inflammation and start paperwork for a new nebulizer medication called Ohtuvayre . You are also referred to a pulmonary rehabilitation program to help improve your symptoms. Your azithromycin prescription will be refilled to prevent further exacerbations.  -PNEUMONIA, RESOLVED: Pneumonia is an infection that inflames the air sacs in one or both lungs. Your pneumonia has resolved, but you still have some inflammation. You should continue using home oxygen therapy at 2 liters per minute. Your oxygen needs will be monitored during pulmonary rehabilitation and reassessed in three months.  -LYMPHEDEMA: Lymphedema is swelling that generally occurs in one of your arms or legs. You have swelling in both legs, likely due to lymphedema. Continue taking Lasix 80 mg daily, and you can take an extra half pill if needed. We recommend using compression stockings and elevating your legs to help manage the swelling.  -CHRONIC BACK PAIN: Chronic back pain is long-lasting pain in your back that can vary in intensity. Your back pain is sometimes sharp and burning and gets worse with coughing and sneezing. We have scheduled an MRI to find out the cause of your back pain.  INSTRUCTIONS:  Please follow up with the pulmonary rehabilitation program and monitor your oxygen needs. We will reassess your oxygen therapy in three months. Continue taking your medications as  prescribed and use compression stockings for your leg swelling. Attend your MRI appointment to evaluate your back pain.

## 2023-06-02 NOTE — Progress Notes (Addendum)
 Austin Glass    [id number]    [birth year]  Primary Care Physician:Elkins, Suzette Espy, MD  Referring Physician: Candiss Chamorro, MD [address] [place],  [place] [address]  Chief complaint: Follow up for dyspnea  HPI: 68 y.o.  smoker with history of asthma  He had an episode of pneumonia in November from a chest x-ray at primary care.  I do not have those images to review.  Repeat chest x-ray 12/29/2020 showed bronchitis changes.  He was treated with doxycycline  and prednisone  and started on breztri .  Referred here for further evaluation  Pets: No pets Occupation: Journalist, newspaper Exposures: Has down pillows.  Note has a damp basement and possible mold.  No hot tubs, Jacuzzis  Smoking history: Half to 1 pack/day for 20 years.  Quit smoking in May 2025 Travel history: No significant travel history Relevant family history: Dad died of emphysema.  He was a smoker.  Interim history: Discussed the use of AI scribe software for clinical note transcription with the patient, who gave verbal consent to proceed.  History of Present Illness Austin Glass is a 68 year old male with COPD who presents with persistent shortness of breath and back pain.  He has been experiencing persistent shortness of breath since his hospitalization for pneumonia. Initially treated with two rounds of oral antibiotics and prednisone , his condition worsened, leading to hospitalization at [facility] at the end of February, followed by a transfer to [facility] in March for six days. During his hospital stay, he received IV antibiotics and was discharged with supplemental oxygen. He continues to use oxygen at two liters per minute, as attempts to reduce it to one and a half liters were unsuccessful. He experiences significant shortness of breath with minimal exertion, such as walking from the bedroom to the bathroom.  He has severe back pain, described as a constant hurt that sometimes  becomes very bad. The pain is sharp and moves around, currently located below his left shoulder blade. He also reports chest pain that radiates and is exacerbated by coughing and sneezing. He visited urgent care shortly after his discharge from [facility] due to back pain and was diagnosed with pleurisy.  He is currently on Breztri , albuterol , and occasionally uses a nebulizer, although he has not been using it regularly. He is also on a daily low-dose azithromycin , prescribed by his primary care physician, to prevent frequent pneumonia exacerbations. He has been off prednisone  for about a month.  He has a history of smoking but has quit for about a month following his recent hospitalizations. He finds it difficult to maintain this cessation but acknowledges the necessity due to his health condition.  He reports significant swelling in his legs, particularly in the ankles, despite being on Lasix 80 mg daily. The swelling is uneven between his legs. He experiences difficulty with physical activity due to his shortness of breath and the need to carry an oxygen tank, which has limited his mobility and may contribute to his leg swelling. No current wheezing.    Outpatient Encounter Medications as of 06/02/2023  Medication Sig   albuterol  (VENTOLIN  HFA) 108 (90 Base) MCG/ACT inhaler Inhale 2 puffs into the lungs every 6 (six) hours as needed for wheezing or shortness of breath (Use when out of your home or if unable to reach your nebulizer machine.).   aspirin  EC 81 MG tablet Take 1 tablet (81 mg total) by mouth daily.  Swallow whole.   azithromycin  (ZITHROMAX ) 250 MG tablet Take 250 mg by mouth daily.   Budeson-Glycopyrrol-Formoterol  (BREZTRI  AEROSPHERE) 160-9-4.8 MCG/ACT AERO Inhale 2 puffs into the lungs every 12 (twelve) hours.   furosemide (LASIX) 80 MG tablet Take 40-80 mg by mouth daily as needed.   ipratropium-albuterol  (DUONEB) 0.5-2.5 (3) MG/3ML SOLN Take 3 mLs by nebulization every 4 (four)  hours as needed (shortness of breath or wheezing. Use when at home.).   pantoprazole  (PROTONIX ) 40 MG tablet Take 40 mg by mouth daily.   traZODone  (DESYREL ) 150 MG tablet Take 0.5-1 tablets (75-150 mg total) by mouth at bedtime for sleep or mood.   No facility-administered encounter medications on file as of 06/02/2023.    Physical Exam: Blood pressure 118/60, height 5' 7 (1.702 m), weight 180 lb 3.2 oz (81.7 kg), SpO2 92%. Gen:      No acute distress HEENT:  EOMI, sclera anicteric Neck:     No masses; no thyromegaly Lungs:    Clear to auscultation bilaterally; normal respiratory effort I printed for him CV:         Regular rate and rhythm; no murmurs Abd:      + bowel sounds; soft, non-tender; no palpable masses, no distension Ext:    B/L LE edema; adequate peripheral perfusion Skin:      Warm and dry; no rash Neuro: alert and oriented x 3 Psych: normal mood and affect   Data Reviewed: Imaging: Chest x-ray 12/28/2020-coarse perihilar bronchovascular markings.   CT angiogram [[facility]] 05/11/2023-previously seen patchy tree-in-bud, reticular density in the right middle lobe is less conspicuous suggestive of resolving pneumonia, no pulmonary embolism   I have reviewed the images personally.  PFTs: 11/21/2021 FVC 2.59 [64%], FEV1 0.98 [32%], F/F38, TLC 8.41 (131%], DLCO 13.12 [54%] Severe obstruction with hyperinflation, air trapping Moderate diffusion defect  Labs: Labs from primary care CBC 12/28/2020-WBC 8.1, eos 0% Metabolic panel-within normal limits  Cardiac: Echocardiogram 04/21/2023 LVEF 65-70%, normal RV systolic size and function, trivial MR, trivial TR.  RVSP could not be estimated  Cardiac CT 05/26/2023-coronary calcium score of 34, nonobstructive coronary artery disease Assessment & Plan Severe COPD with exacerbation Severe COPD with recent exacerbation, multiple hospitalizations due to exacerbations and pneumonia. Currently on Breztri  and albuterol . Quit  smoking for a month, beneficial for COPD management. Persistent dyspnea with minimal exertion. Considering injection therapy with Dupixent if blood tests indicate significant inflammation. Pulmonary rehabilitation recommended to improve symptoms and reduce hospitalizations. New research suggests Ohtuvayre  nebulizer medication may reduce exacerbation frequency and improve symptoms, though complete resolution is unlikely due to severity.  - Continue Breztri  and albuterol . - Order CBC, IgE, and alpha-1 antitrypsin to assess inflammation. - Initiate paperwork for Ohtuvayre  nebulizer medication. - Refer to pulmonary rehabilitation program. - Refill azithromycin  for chronic use to prevent exacerbations.  Exposure to down pillow,, possible mold mold Recent CT scan is not suggestive of interstitial lung disease.  Get follow-up high-res CT before return visit for better evaluation. Check hypersensitivity pneumonitis panel  Pneumonia, resolved Pneumonia resolved, residual inflammation on CT scan. Hospitalized and treated with IV antibiotics and oxygen therapy. Currently on home oxygen at 2 L/min. Oxygen needs to be monitored during pulmonary rehabilitation and reassessed in three months. - Continue home oxygen therapy at 2 L/min. - Monitor oxygen needs during pulmonary rehabilitation and reassess in three months.  Lymphedema Bilateral leg swelling, likely lymphedema.  Echo does not show any evidence of heart failure or pulmonary hypertension, previous BNP is normal. On  Lasix 80 mg daily, but swelling persists. Compression stockings and leg elevation recommended to manage fluid accumulation. Swelling may be exacerbated by reduced activity due to dyspnea and oxygen use. - Continue Lasix 80 mg daily, with option to take an extra half pill if needed. - Recommend compression stockings and leg elevation.  Chronic back pain Chronic back pain with variable location and intensity, sometimes sharp and burning,  exacerbated by coughing and sneezing. MRI has been scheduled to evaluate cause. CT scan ruled out pneumonia-related causes and blood clots. - Proceed with scheduled MRI to evaluate back pain.  Active smoker Quit smoking last month Encouraged to stay off cigarettes Will need annual screening CTs in the future  Plan/Recommendations: Continue Breztri  Add Ohtyvayre Continue chronic azithromycin  Pulmonary rehab Follow-up high-res CT  Phyllis Breeze MD [facility] 06/02/2023, 10:15 AM  CC: Candiss Chamorro, *    `

## 2023-06-04 ENCOUNTER — Encounter: Payer: Self-pay | Admitting: Pulmonary Disease

## 2023-06-04 ENCOUNTER — Telehealth: Payer: Self-pay

## 2023-06-04 NOTE — Telephone Encounter (Signed)
 Ohtuvayre  forms sent to pharmacy

## 2023-06-04 NOTE — Telephone Encounter (Signed)
 Received Ohtuvayre new start paperwork. Completed form and faxed with clinicals and insurance card copy to [facility]   Phone#: [phone] Fax#: [phone]   Chesley Mires, PharmD, MPH, BCPS, CPP Clinical Pharmacist (Rheumatology and Pulmonology)

## 2023-06-05 ENCOUNTER — Encounter: Payer: Self-pay | Admitting: Pulmonary Disease

## 2023-06-06 LAB — HYPERSENSITIVITY PNEUMONITIS
A. Pullulans Abs: NEGATIVE
A.Fumigatus #1 Abs: NEGATIVE
Micropolyspora faeni, IgG: NEGATIVE
Pigeon Serum Abs: NEGATIVE
Thermoact. Saccharii: NEGATIVE
Thermoactinomyces vulgaris, IgG: NEGATIVE

## 2023-06-07 LAB — ALPHA-1 ANTITRYPSIN PHENOTYPE: A-1 Antitrypsin, Ser: 172 mg/dL (ref 83–199)

## 2023-06-07 LAB — IGE: IgE (Immunoglobulin E), Serum: 252 kU/L — ABNORMAL HIGH (ref <=114)

## 2023-06-10 ENCOUNTER — Other Ambulatory Visit

## 2023-06-11 ENCOUNTER — Telehealth: Payer: Self-pay

## 2023-06-11 NOTE — Telephone Encounter (Signed)
 Spouse calling for CT Results

## 2023-06-11 NOTE — Telephone Encounter (Signed)
 The results from your cardiac CT imaging show mild amount of plaque buildup in the blood vessels that supply the heart.  There was no evidence of any significant narrowing in the blood vessels that supply the heart.  Overall this is good news and reassuring.   Will review further report from radiologist regarding findings outside of the heart when they become available, this can typically take 1 to 2 weeks. Please do not hesitate to contact my office with any questions.  Thank you  Advised to FU with pulmonary provider for lung findings. Results routed to Dr. Waylan Haggard.  Results reviewed with Austin Glass per DPR as per Dr. Madireddy's note. [name] verbalized understanding and had no additional questions. Routed to PCP.

## 2023-06-12 ENCOUNTER — Telehealth: Payer: Self-pay

## 2023-06-12 ENCOUNTER — Encounter: Payer: Self-pay | Admitting: Pulmonary Disease

## 2023-06-12 NOTE — Telephone Encounter (Signed)
 Copied from CRM [id number]. Topic: Clinical - Request for Lab/Test Order >> Jun 11, 2023  2:58 PM Crist Dominion wrote: Reason for CRM: Patients wife states that the patient got a MyChart message for a test result of a cardiac CT and states there is a lung portion on the results and is wondering if Dr. Waylan Haggard could go over this with the patient.   Dr Waylan Haggard, please advise

## 2023-06-13 NOTE — Telephone Encounter (Signed)
 Called and discussed with patient.  CT images show lung findings of mild tree-in-bud with waxing and waning.  Looks like chronic bronchitis.  May have MAI infection but does not need acute intervention at present.  He does not have signs and symptoms of pneumonia.  Will continue to observe.  He will call back if he has excessive cough, mucus production for antibiotics Continue supplemental oxygen during exertion and at night High-res CT follow-up already ordered.  Will reassess at that time.  Nothing further needed

## 2023-06-13 NOTE — Telephone Encounter (Signed)
 Duplicate see previous telephone message

## 2023-06-17 ENCOUNTER — Telehealth (HOSPITAL_BASED_OUTPATIENT_CLINIC_OR_DEPARTMENT_OTHER): Payer: Self-pay

## 2023-06-17 NOTE — Telephone Encounter (Signed)
 Copied from CRM [id number]. Topic: General - Other >> Jun 17, 2023 11:37 AM Ambrose Junk wrote: Reason for CRM:  [employer] @ [facility], Please review med Key for Pre-Auth Ohtuvayre  med. Initiated pre-auth.   [employer] - [phone] ext [id number]

## 2023-06-18 NOTE — Telephone Encounter (Signed)
 Submitted a Prior Authorization request to [facility] ADVANTAGE/RX ADVANCE for OHTUVAYRE  via CoverMyMeds. Will update once we receive a response.  Key: [id number]   Per automated response: Prior Authorization duplicate/in progress

## 2023-06-18 NOTE — Telephone Encounter (Signed)
 Received PA request key initiated via CMM by a [facility] rep. Unfortunately they initiated the request under the name "Austin Glass".  Submitted a NEW Prior Authorization request to [facility] ADVANTAGE/RX ADVANCE for OHTUVAYRE  via CoverMyMeds. Will update once we receive a response.  Key: [id number]

## 2023-06-20 NOTE — Telephone Encounter (Signed)
 Received notification from [employer]VANT[employer]/RX ADVANCE regarding a prior authorization for OHTUVAYRE . Authorization has been APPROVED from 06/18/23 to 01/28/24. Approval letter sent to scan center.  Authorization # [id number]  Auth approval faxed to [employer]  Geraldene Kleine, PharmD, MPH, BCPS, CPP Clinical Pharmacist (Rheumatology and Pulmonology)

## 2023-07-11 ENCOUNTER — Other Ambulatory Visit: Payer: Self-pay

## 2023-07-11 MED ORDER — OMEPRAZOLE 40 MG PO CPDR: 40.0000 mg | DELAYED_RELEASE_CAPSULE | Freq: Every day | ORAL | 1 refills | Status: DC

## 2023-07-11 NOTE — Telephone Encounter (Signed)
 Omeprazole  refilled as requested by pharmacy. According to last office visit was on the omeprazole .

## 2023-07-14 ENCOUNTER — Ambulatory Visit: Payer: Self-pay | Admitting: Pulmonary Disease

## 2023-07-14 DIAGNOSIS — I890 Lymphedema, not elsewhere classified: Secondary | ICD-10-CM

## 2023-07-14 NOTE — Telephone Encounter (Signed)
 FYI Only or Action Required?: Action required by provider  Patient is followed in Pulmonology for COPD, last seen on 06/02/2023 by Mannam, Praveen, MD. Called Nurse Triage reporting Leg Swelling and back surgery canceled this morning for swelling. Symptoms began several weeks ago. Interventions attempted: Prescription medications: lasix 2x/day per Dr. Waylan Haggard and Increased fluids/rest. Symptoms are: gradually worsening.  Triage Disposition: See HCP Within 4 Hours (Or PCP Triage)  Patient/caregiver understands and will follow disposition?: Yes - Surgeon told pt to follow up immediately with Dr. Waylan Haggard specifically, been checked by cardio, please call back asap        Copied from CRM [id number]. Topic: Clinical - Red Word Triage >> Jul 14, 2023  7:50 AM Juliana Ocean wrote: Red Word that prompted transfer to Nurse Triage: pt went this am to have back surgery this morning and his legs are so swollen they refused.  Told him to Dr Waylan Haggard asap Reason for Disposition  [1] Thigh, calf, or ankle swelling AND [2] bilateral AND [3] 1 side is more swollen  Answer Assessment - Initial Assessment Questions 1. ONSET: When did the swelling start? (e.g., minutes, hours, days)     Never gone away, last time saw Dr. Waylan Haggard, pt showed him his legs, Mannam told him he could take 2 lasix per day instead of 1, doing so but because back pain having to sit straight up so making swelling worse, started several months ago, worsened in the past 2 weeks 2. LOCATION: What part of the leg is swollen?  Are both legs swollen or just one leg?     Both legs, think swollen equally, right side a little more swollen 3. SEVERITY: How bad is the swelling? (e.g., localized; mild, moderate, severe)   - Localized: Small area of swelling localized to one leg.   - MILD pedal edema: Swelling limited to foot and ankle, pitting edema < 1/4 inch (6 mm) deep, rest and elevation eliminate most or all swelling.   - MODERATE edema: Swelling of  lower leg to knee, pitting edema > 1/4 inch (6 mm) deep, rest and elevation only partially reduce swelling.   - SEVERE edema: Swelling extends above knee, facial or hand swelling present.      Pitting edema from calf down 4. REDNESS: Does the swelling look red or infected?     no 5. PAIN: Is the swelling painful to touch? If Yes, ask: How painful is it?   (Scale 1-10; mild, moderate or severe)     no 6. FEVER: Do you have a fever? If Yes, ask: What is it, how was it measured, and when did it start?      no 8. MEDICAL HISTORY: Do you have a history of blood clots (e.g., DVT), cancer, heart failure, kidney disease, or liver failure?     denies 9. RECURRENT SYMPTOM: Have you had leg swelling before? If Yes, ask: When was the last time? What happened that time?     Several months now 10. OTHER SYMPTOMS: Do you have any other symptoms? (e.g., chest pain, difficulty breathing)       No more than SOB than usual, no chest pain Has dizziness from back pain meds, cannot take a deep breath because of his back pain    Still at surgical center Heart has been checked out completely Want him to be checked by Dr. Waylan Haggard asap  Protocols used: Leg Swelling and Edema-A-AH

## 2023-07-15 NOTE — Telephone Encounter (Signed)
 Patient had been scheduled for 6/27. We can try to get him in sooner with an APP if there is an opening. Can you seen if the ordered HRCT can be done this week and please make a referral to [facility] for evaluation of lynphedema  I also called and advised he reach out to his cardiologist for sooner appointment for eval of LE edema.

## 2023-07-15 NOTE — Telephone Encounter (Signed)
 Pt is aware of below message and voiced his understanding.  CT has bene ordered.  Referral placed to vascular and vein.  Pt will keep scheduled appt, as NP's do not have availability sooner.   PCC's, please scheduled CT for this week.

## 2023-07-15 NOTE — Addendum Note (Signed)
 Addended by: Katie Parks A on: 07/15/2023 11:41 AM   Modules accepted: Orders

## 2023-07-16 ENCOUNTER — Telehealth: Payer: Self-pay | Admitting: Pulmonary Disease

## 2023-07-16 NOTE — Telephone Encounter (Signed)
 Fax received from Dr. Larrie Po with [facility] to perform a Kyphoplasty under general anesthesia on patient.  Patient needs surgery clearance. Surgery is pending. Patient was seen on 06/02/23. Office protocol is a risk assessment can be sent to surgeon if patient has been seen in 60 days or less.   Pt has upcoming visit with Dr. Waylan Haggard to have risk assessment done for this surgery. I will route this to clearance pool until that visit is complete.

## 2023-07-17 ENCOUNTER — Telehealth: Payer: Self-pay

## 2023-07-17 NOTE — Telephone Encounter (Signed)
 Copied from CRM [id number]. Topic: Clinical - Request for Lab/Test Order >> Jul 16, 2023  1:45 PM Isabell A wrote: Reason for CRM: Patient would like to confirm where he is supposed to be scheduled for the CT of his chest.   Callback number: [phone]

## 2023-07-18 ENCOUNTER — Ambulatory Visit (HOSPITAL_BASED_OUTPATIENT_CLINIC_OR_DEPARTMENT_OTHER)
Admission: RE | Admit: 2023-07-18 | Discharge: 2023-07-18 | Disposition: A | Discharge status: Home / Self Care | Source: Ambulatory Visit | Attending: Pulmonary Disease | Admitting: Pulmonary Disease

## 2023-07-18 ENCOUNTER — Encounter (HOSPITAL_BASED_OUTPATIENT_CLINIC_OR_DEPARTMENT_OTHER): Payer: Self-pay | Admitting: Radiology

## 2023-07-18 DIAGNOSIS — J439 Emphysema, unspecified: Secondary | ICD-10-CM

## 2023-07-25 ENCOUNTER — Encounter: Payer: Self-pay | Admitting: Pulmonary Disease

## 2023-07-25 ENCOUNTER — Telehealth: Payer: Self-pay

## 2023-07-25 ENCOUNTER — Ambulatory Visit: Admitting: Pulmonary Disease

## 2023-07-25 VITALS — BP 124/74 | HR 70 | Ht 67.0 in | Wt 176.6 lb

## 2023-07-25 DIAGNOSIS — J439 Emphysema, unspecified: Secondary | ICD-10-CM | POA: Diagnosis not present

## 2023-07-25 DIAGNOSIS — Z87891 Personal history of nicotine dependence: Secondary | ICD-10-CM | POA: Diagnosis not present

## 2023-07-25 DIAGNOSIS — R0600 Dyspnea, unspecified: Secondary | ICD-10-CM

## 2023-07-25 DIAGNOSIS — J4489 Other specified chronic obstructive pulmonary disease: Secondary | ICD-10-CM | POA: Diagnosis not present

## 2023-07-25 NOTE — Telephone Encounter (Signed)
 Risk assessment faxed to [facility]

## 2023-07-25 NOTE — Telephone Encounter (Signed)
 Preop assessment completed on office note dated 07/25/2023

## 2023-07-25 NOTE — Progress Notes (Signed)
 Austin Glass    [id number]    [birth year]  Primary Care Physician:Elkins, Tanda Mae, MD  Referring Physician: Loring Tanda Mae, MD [address] [place],  [place] [address]  Chief complaint: Follow up for dyspnea, COPD  HPI: Discussed the use of AI scribe software for clinical note transcription with the patient, who gave verbal consent to proceed.  History of Present Illness Austin Glass is a 68 year old male with COPD, maintained on Breztri  inhaler  Hospitalized in February 2025 for COPD with acute exacerbation, community-acquired pneumonia.  He has been experiencing persistent shortness of breath since his hospitalization for pneumonia. Initially treated with two rounds of oral antibiotics and prednisone , his condition worsened, leading to hospitalization at [facility] at the end of February, followed by a transfer to [facility] in March for six days. During his hospital stay, he received IV antibiotics and was discharged with supplemental oxygen. He continues to use oxygen at two liters per minute, as attempts to reduce it to one and a half liters were unsuccessful. He experiences significant shortness of breath with minimal exertion, such as walking from the bedroom to the bathroom.  He is currently on Breztri , albuterol , and occasionally uses a nebulizer, although he has not been using it regularly. He is also on a daily low-dose azithromycin , prescribed by his primary care physician, to prevent frequent pneumonia exacerbations. Ohtuvayre  nebs was started in May 2025  He has a history of smoking but has quit around March 2025 following his recent hospitalizations. He finds it difficult to maintain this cessation but acknowledges the necessity due to his health condition.  He reports significant swelling in his legs, particularly in the ankles, despite being on Lasix 80 mg daily. The swelling is uneven between his legs. He experiences difficulty with  physical activity due to his shortness of breath and the need to carry an oxygen tank, which has limited his mobility and may contribute to his leg swelling. No current wheezing.  Interim history: His breathing has improved since the last visit, with oxygen levels in the mid-nineties. He uses a Breztri  inhaler for COPD and continues to use oxygen at night at two liters, although he prefers sleeping without it. A recent CT scan showed mucus in the right lung but no pneumonia. He has choked on saliva but denies choking on food or difficulty swallowing.  He experiences leg swelling, which has improved with leg elevation and increased pain medication for back pain, allowing him to sleep in bed. He takes a diuretic for swelling and has tried compression stockings, which were ineffective. The swelling has been long-standing, with sock marks noted for years.  He has chronic back pain affecting his ability to cough effectively, possibly contributing to mucus retention. Sneezing is particularly painful due to back pain.  He was scheduled for kyphoplasty earlier this month but was canceled after anesthesia noted lower extremity swelling and has been sent back to pulmonary for evaluation.  He uses a nebulizer with a new medication Ohtuvayre  twice daily, sometimes only once due to being away from home. He takes chronic azithromycin  daily to prevent hospitalizations.  Outpatient Encounter Medications as of 07/25/2023  Medication Sig   albuterol  (VENTOLIN  HFA) 108 (90 Base) MCG/ACT inhaler Inhale 2 puffs into the lungs every 6 (six) hours as needed for wheezing or shortness of breath (Use when out of your home or if unable to reach your nebulizer machine.).   aspirin   EC 81 MG tablet Take 1 tablet (81 mg total) by mouth daily. Swallow whole.   azithromycin  (ZITHROMAX ) 250 MG tablet Take 1 tablet (250 mg total) by mouth daily.   Budeson-Glycopyrrol-Formoterol  (BREZTRI  AEROSPHERE) 160-9-4.8 MCG/ACT AERO Inhale 2 puffs  into the lungs every 12 (twelve) hours.   furosemide (LASIX) 80 MG tablet Take 40-80 mg by mouth daily as needed.   ipratropium-albuterol  (DUONEB) 0.5-2.5 (3) MG/3ML SOLN Take 3 mLs by nebulization every 4 (four) hours as needed (shortness of breath or wheezing. Use when at home.).   omeprazole  (PRILOSEC) 40 MG capsule Take 1 capsule (40 mg total) by mouth daily.   traZODone  (DESYREL ) 150 MG tablet Take 0.5-1 tablets (75-150 mg total) by mouth at bedtime for sleep or mood.   No facility-administered encounter medications on file as of 07/25/2023.    Physical Exam: Blood pressure 124/74, pulse 70, height 5' 7 (1.702 m), weight 176 lb 9.6 oz (80.1 kg), SpO2 90%. Gen:      No acute distress HEENT:  EOMI, sclera anicteric Neck:     No masses; no thyromegaly Lungs:    Clear to auscultation bilaterally; normal respiratory effort CV:         Regular rate and rhythm; no murmurs Abd:      + bowel sounds; soft, non-tender; no palpable masses, no distension Ext:    No edema; adequate peripheral perfusion Neuro: alert and oriented x 3 Psych: normal mood and affect   Data Reviewed: Imaging: Chest x-ray 12/28/2020-coarse perihilar bronchovascular markings.   CT angiogram [[facility]] 05/11/2023-previously seen patchy tree-in-bud, reticular density in the right middle lobe is less conspicuous suggestive of resolving pneumonia, no pulmonary embolism  High-res CT 07/23/2023-no evidence of interstitial lung disease, endobronchial debris's and mucoid impaction in the right lower lobe, emphysema.  I have reviewed the images personally.  PFTs: 11/21/2021 FVC 2.59 [64%], FEV1 0.98 [32%], F/F38, TLC 8.41 (131%], DLCO 13.12 [54%] Severe obstruction with hyperinflation, air trapping Moderate diffusion defect  Labs: Labs from primary care CBC 12/28/2020-WBC 8.1, eos 0% Metabolic panel-within normal limits  Cardiac: Echocardiogram 04/21/2023 LVEF 65-70%, normal RV systolic size and function, trivial  MR, trivial TR.  RVSP could not be estimated  Cardiac CT 05/26/2023-coronary calcium score of 34, nonobstructive coronary artery disease  Assessment and plan: Severe COPD Severe COPD is well-controlled with no recent exacerbations. Last hospitalization was in March 2025. Oxygen saturation is in the mid-90s.  Recent CT scan shows no pneumonia but some mucus in the right lung, possibly due to aspiration. No current swallowing issues reported. Back pain may be affecting his ability to cough effectively. Continues to use oxygen, especially at night, to prevent strain on the heart. Smoking cessation is ongoing, which is beneficial for COPD management. Currently using a nebulizer with Otuvayre twice a day, which helps in lung function improvement. He is at mild to intermediate risk for surgery due to COPD, but the benefits of increased physical activity post-surgery outweigh the risks. - Continue using the nebulizer with Ohtuvayre  twice a day. - Order a home sleep study to evaluate oxygen levels and check for sleep apnea. - Continue chronic azithromycin  daily to prevent hospitalizations. - Send clearance letter to Dr. Mavis' office for surgery. - Order a second nebulizer for use at a different location. - Encourage continued smoking cessation, especially until after surgery.  Pneumonia Recent hospitalization at [facility] in early 2025 for pneumonia.  Pneumonia has resolved as per recent CT scan. No current signs of pneumonia. Mucus in  the right lung noted, possibly due to ineffective coughing related to back pain.  Lymphedema Swelling in the legs is likely due to lymphedema, not heart failure, as echocardiogram and stress test were normal. Elevation and use of a diuretic have helped reduce swelling. Compression stockings were not effective. Referral to a vascular and vein specialist is planned to evaluate leg drainage and suggest further treatment options. - Refer to a vascular and vein specialist for  evaluation and treatment suggestions. - Continue elevation of legs and use of diuretic as needed.   Peri-operative Assessment of Pulmonary Risk for Non-Thoracic Surgery: Asked to provide pulmonary risk assessment for planned thoracic kyphoplasty  By ARISCAT criteria patient has  Low risk with 1.6% risk of in-hospital post-op pulmonary complications (composite including respiratory failure, respiratory infection, pleural effusion, atelectasis, pneumothorax, bronchospasm, aspiration pneumonitis)  ForMs. Lewis, risk of perioperative pulmonary complications is increased by:  Age greater than 65 years  COPD   Respiratory complications generally occur in 1% of ASA Class I patients, 5% of ASA Class II and 10% of ASA Class III-IV patients These complications rarely result in mortality and iclude postoperative pneumonia, atelectasis, pulmonary embolism, ARDS and increased time requiring postoperative mechanical ventilation.  Overall, I recommend proceeding with the surgery if the risk for respiratory complications are outweighed by the potential benefits. This will need to be discussed between the patient and surgeon.  To reduce risks of respiratory complications, I recommend: --Smoking cessation 6-8 weeks if possible --Pre- and post-operative incentive spirometry performed frequently while awake --Avoiding use of pancuronium during anesthesia.   I have discussed the risk factors and recommendations above with the patient.   Plan/Recommendations: Continue Breztri , Ohtyvayre Continue chronic azithromycin  Pulmonary rehab  Lonna Coder MD [facility] 07/25/2023, 9:53 AM  CC: Loring Tanda Mae, *    `

## 2023-07-25 NOTE — Telephone Encounter (Signed)
 Copied from CRM [id number]. Topic: Clinical - Request for Lab/Test Order >> Jul 24, 2023 10:55 AM Austin Glass wrote: Reason for CRM: Patient is requesting Dr. Cydney nurse to please fax his CT imaging results from 6/20 to Dr. Mavis so that patient can be scheduled for his back surgery.   Dr. Reyes Mavis at [facility]   CT has been faxed to Dr. Mavis via epic. Patient is aware and voiced his und understanding.

## 2023-07-25 NOTE — Patient Instructions (Signed)
 VISIT SUMMARY:  You came in today for a follow-up on your breathing and leg swelling. Your breathing has improved, and your oxygen levels are stable. You are using your Breztri  inhaler and oxygen at night. Your leg swelling has also improved with leg elevation and pain medication. A recent CT scan showed mucus in your right lung but no pneumonia. You have stopped smoking recently and are using candy as a substitute.  YOUR PLAN:  -SEVERE COPD: Chronic Obstructive Pulmonary Disease (COPD) is a long-term lung condition that makes it hard to breathe. Your COPD is well-controlled with no recent flare-ups. Continue using your nebulizer with Otovayre twice a day and your chronic azithromycin  daily. A home sleep study will be ordered to check your oxygen levels and for sleep apnea. A clearance letter will be sent to Dr. Mavis' office for your upcoming surgery. A second nebulizer will be ordered for use at a different location. Keep up with smoking cessation, especially until after your surgery.  -PNEUMONIA: Pneumonia is an infection that inflames the air sacs in one or both lungs. Your recent CT scan shows that your pneumonia has resolved, but there is still some mucus in your right lung, likely due to ineffective coughing related to your back pain.  -LYMPHEDEMA: Lymphedema is swelling that generally occurs in one of your arms or legs due to a blockage in your lymphatic system. Your leg swelling has improved with elevation and diuretics. Compression stockings were not effective. You will be referred to a vascular and vein specialist for further evaluation and treatment suggestions. Continue to elevate your legs and use diuretics as needed.  INSTRUCTIONS:  A home sleep study will be ordered to evaluate your oxygen levels and check for sleep apnea. A clearance letter will be sent to Dr. Mavis' office for your upcoming surgery. You will be referred to a vascular and vein specialist for further evaluation and  treatment suggestions.

## 2023-07-26 ENCOUNTER — Ambulatory Visit: Payer: Self-pay

## 2023-07-31 ENCOUNTER — Other Ambulatory Visit: Payer: Self-pay | Admitting: Neurosurgery

## 2023-07-31 ENCOUNTER — Telehealth: Payer: Self-pay | Admitting: *Deleted

## 2023-07-31 NOTE — Telephone Encounter (Signed)
 Copied from CRM [id number]. Topic: Medical Record Request - Records Request >> Jul 30, 2023  4:46 PM [name] wrote: Reason for CRM: [name] called in regarding faxing clearance letter to [name] office at fax number [phone] for the patient. [name] was thankful and verbalized understanding.  Copy of risk assessment 07/25/23 was faxed to Dr. Mavis office at the number provided. Fax confirmation received.

## 2023-08-05 NOTE — Pre-Procedure Instructions (Signed)
 Surgical Instructions   Your procedure is scheduled on Thursday, July 10th. Report to [facility] Main Entrance A at 12:50 P.M., then check in with the Admitting office. Any questions or running late day of surgery: call [phone]  Questions prior to your surgery date: call [phone], Monday-Friday, 8am-4pm. If you experience any cold or flu symptoms such as cough, fever, chills, shortness of breath, etc. between now and your scheduled surgery, please notify us  at the above number.     Remember:  Do not eat after midnight the night before your surgery  You may drink clear liquids until 11:50 AM the morning of your surgery.   Clear liquids allowed are: Water, Non-Citrus Juices (without pulp), Carbonated Beverages, Clear Tea (no milk, honey, etc.), Black Coffee Only (NO MILK, CREAM OR POWDERED CREAMER of any kind), and Gatorade.    Take these medicines the morning of surgery with A SIP OF WATER  azithromycin  (ZITHROMAX )  Budeson-Glycopyrrol-Formoterol  (BREZTRI  AEROSPHERE)  OHTUVAYRE   omeprazole  (PRILOSEC)    May take these medicines IF NEEDED: albuterol  (VENTOLIN  HFA)- bring inhaler with you on day of surgery HYDROcodone -acetaminophen  (NORCO)  ipratropium-albuterol  (DUONEB)     One week prior to surgery, STOP taking any Aspirin  (unless otherwise instructed by your surgeon) Aleve, Naproxen, Ibuprofen, Motrin, Advil, Goody's, BC's, all herbal medications, fish oil, and non-prescription vitamins.                     Do NOT Smoke (Tobacco/Vaping) for 24 hours prior to your procedure.  If you use a CPAP at night, you may bring your mask/headgear for your overnight stay.   You will be asked to remove any contacts, glasses, piercing's, hearing aid's, dentures/partials prior to surgery. Please bring cases for these items if needed.    Patients discharged the day of surgery will not be allowed to drive home, and someone needs to stay with them for 24 hours.  SURGICAL WAITING  ROOM VISITATION Patients may have no more than 2 support people in the waiting area - these visitors may rotate.   Pre-op nurse will coordinate an appropriate time for 1 ADULT support person, who may not rotate, to accompany patient in pre-op.  Children under the age of 86 must have an adult with them who is not the patient and must remain in the main waiting area with an adult.  If the patient needs to stay at the hospital during part of their recovery, the visitor guidelines for inpatient rooms apply.  Please refer to the [facility] website for the visitor guidelines for any additional information.   If you received a COVID test during your pre-op visit  it is requested that you wear a mask when out in public, stay away from anyone that may not be feeling well and notify your surgeon if you develop symptoms. If you have been in contact with anyone that has tested positive in the last 10 days please notify you surgeon.      Pre-operative 5 CHG Bathing Instructions   You can play a key role in reducing the risk of infection after surgery. Your skin needs to be as free of germs as possible. You can reduce the number of germs on your skin by washing with CHG (chlorhexidine  gluconate) soap before surgery. CHG is an antiseptic soap that kills germs and continues to kill germs even after washing.   DO NOT use if you have an allergy to chlorhexidine /CHG or antibacterial soaps. If your skin becomes reddened or  irritated, stop using the CHG and notify one of our RNs at [phone].   Please shower with the CHG soap starting 4 days before surgery using the following schedule:     Please keep in mind the following:  DO NOT shave, including legs and underarms, starting the day of your first shower.   You may shave your face at any point before/day of surgery.  Place clean sheets on your bed the day you start using CHG soap. Use a clean washcloth (not used since being washed) for each shower. DO  NOT sleep with pets once you start using the CHG.   CHG Shower Instructions:  Wash your face and private area with normal soap. If you choose to wash your hair, wash first with your normal shampoo.  After you use shampoo/soap, rinse your hair and body thoroughly to remove shampoo/soap residue.  Turn the water OFF and apply about 3 tablespoons (45 ml) of CHG soap to a CLEAN washcloth.  Apply CHG soap ONLY FROM YOUR NECK DOWN TO YOUR TOES (washing for 3-5 minutes)  DO NOT use CHG soap on face, private areas, open wounds, or sores.  Pay special attention to the area where your surgery is being performed.  If you are having back surgery, having someone wash your back for you may be helpful. Wait 2 minutes after CHG soap is applied, then you may rinse off the CHG soap.  Pat dry with a clean towel  Put on clean clothes/pajamas   If you choose to wear lotion, please use ONLY the CHG-compatible lotions that are listed below.  Additional instructions for the day of surgery: DO NOT APPLY any lotions, deodorants, cologne, or perfumes.   Do not bring valuables to the hospital. [facility] is not responsible for any belongings/valuables. Do not wear nail polish, gel polish, artificial nails, or any other type of covering on natural nails (fingers and toes) Do not wear jewelry or makeup Put on clean/comfortable clothes.  Please brush your teeth.  Ask your nurse before applying any prescription medications to the skin.     CHG Compatible Lotions   Aveeno Moisturizing lotion  Cetaphil Moisturizing Cream  Cetaphil Moisturizing Lotion  Clairol Herbal Essence Moisturizing Lotion, Dry Skin  Clairol Herbal Essence Moisturizing Lotion, Extra Dry Skin  Clairol Herbal Essence Moisturizing Lotion, Normal Skin  Curel Age Defying Therapeutic Moisturizing Lotion with Alpha Hydroxy  Curel Extreme Care Body Lotion  Curel Soothing Hands Moisturizing Hand Lotion  Curel Therapeutic Moisturizing Cream,  Fragrance-Free  Curel Therapeutic Moisturizing Lotion, Fragrance-Free  Curel Therapeutic Moisturizing Lotion, Original Formula  Eucerin Daily Replenishing Lotion  Eucerin Dry Skin Therapy Plus Alpha Hydroxy Crme  Eucerin Dry Skin Therapy Plus Alpha Hydroxy Lotion  Eucerin Original Crme  Eucerin Original Lotion  Eucerin Plus Crme Eucerin Plus Lotion  Eucerin TriLipid Replenishing Lotion  Keri Anti-Bacterial Hand Lotion  Keri Deep Conditioning Original Lotion Dry Skin Formula Softly Scented  Keri Deep Conditioning Original Lotion, Fragrance Free Sensitive Skin Formula  Keri Lotion Fast Absorbing Fragrance Free Sensitive Skin Formula  Keri Lotion Fast Absorbing Softly Scented Dry Skin Formula  Keri Original Lotion  Keri Skin Renewal Lotion Keri Silky Smooth Lotion  Keri Silky Smooth Sensitive Skin Lotion  Nivea Body Creamy Conditioning Oil  Nivea Body Extra Enriched Teacher, adult education Moisturizing Lotion Nivea Crme  Nivea Skin Firming Lotion  NutraDerm 30 Skin Lotion  NutraDerm Skin Lotion  NutraDerm Therapeutic Skin Cream  NutraDerm  Therapeutic Skin Lotion  ProShield Protective Hand Cream  Provon moisturizing lotion  Please read over the following fact sheets that you were given.

## 2023-08-06 ENCOUNTER — Encounter (HOSPITAL_COMMUNITY)
Admission: RE | Admit: 2023-08-06 | Discharge: 2023-08-06 | Disposition: A | Discharge status: Home / Self Care | Source: Ambulatory Visit | Attending: Neurosurgery | Admitting: Neurosurgery

## 2023-08-06 ENCOUNTER — Other Ambulatory Visit: Payer: Self-pay

## 2023-08-06 ENCOUNTER — Encounter (HOSPITAL_COMMUNITY): Payer: Self-pay

## 2023-08-06 VITALS — BP 149/80 | HR 83 | Temp 98.0°F | Resp 17 | Ht 67.0 in | Wt 177.6 lb

## 2023-08-06 DIAGNOSIS — M8008XA Age-related osteoporosis with current pathological fracture, vertebra(e), initial encounter for fracture: Secondary | ICD-10-CM | POA: Diagnosis not present

## 2023-08-06 DIAGNOSIS — Z9981 Dependence on supplemental oxygen: Secondary | ICD-10-CM | POA: Diagnosis not present

## 2023-08-06 DIAGNOSIS — I2510 Atherosclerotic heart disease of native coronary artery without angina pectoris: Secondary | ICD-10-CM | POA: Insufficient documentation

## 2023-08-06 DIAGNOSIS — Z87891 Personal history of nicotine dependence: Secondary | ICD-10-CM | POA: Insufficient documentation

## 2023-08-06 DIAGNOSIS — K219 Gastro-esophageal reflux disease without esophagitis: Secondary | ICD-10-CM | POA: Diagnosis not present

## 2023-08-06 DIAGNOSIS — R0609 Other forms of dyspnea: Secondary | ICD-10-CM | POA: Insufficient documentation

## 2023-08-06 DIAGNOSIS — I493 Ventricular premature depolarization: Secondary | ICD-10-CM | POA: Insufficient documentation

## 2023-08-06 DIAGNOSIS — J439 Emphysema, unspecified: Secondary | ICD-10-CM | POA: Diagnosis not present

## 2023-08-06 DIAGNOSIS — Z01812 Encounter for preprocedural laboratory examination: Secondary | ICD-10-CM | POA: Diagnosis present

## 2023-08-06 DIAGNOSIS — Z01818 Encounter for other preprocedural examination: Secondary | ICD-10-CM

## 2023-08-06 HISTORY — DX: Anemia, unspecified: D64.9

## 2023-08-06 HISTORY — DX: Dyspnea, unspecified: R06.00

## 2023-08-06 LAB — CBC
HCT: 44.4 % (ref 39.0–52.0)
Hemoglobin: 14.6 g/dL (ref 13.0–17.0)
MCH: 31.5 pg (ref 26.0–34.0)
MCHC: 32.9 g/dL (ref 30.0–36.0)
MCV: 95.7 fL (ref 80.0–100.0)
Platelets: 351 K/uL (ref 150–400)
RBC: 4.64 MIL/uL (ref 4.22–5.81)
RDW: 13.6 % (ref 11.5–15.5)
WBC: 9.6 K/uL (ref 4.0–10.5)
nRBC: 0 % (ref 0.0–0.2)

## 2023-08-06 LAB — SURGICAL PCR SCREEN
MRSA, PCR: NEGATIVE
Staphylococcus aureus: NEGATIVE

## 2023-08-06 LAB — BASIC METABOLIC PANEL WITH GFR
Anion gap: 10 (ref 5–15)
BUN: 17 mg/dL (ref 8–23)
CO2: 31 mmol/L (ref 22–32)
Calcium: 9.8 mg/dL (ref 8.9–10.3)
Chloride: 101 mmol/L (ref 98–111)
Creatinine, Ser: 0.87 mg/dL (ref 0.61–1.24)
GFR, Estimated: >60 mL/min (ref >60)
Glucose, Bld: 97 mg/dL (ref 70–99)
Potassium: 3.4 mmol/L — ABNORMAL LOW (ref 3.5–5.1)
Sodium: 142 mmol/L (ref 135–145)

## 2023-08-06 NOTE — Progress Notes (Signed)
 Anesthesia Chart Review:  Case: [id number] Date/Time: 08/07/23 1437   Procedure: KYPHOPLASTY - Kyphoplasty - T6   Anesthesia type: General   Diagnosis: Age-related osteoporosis with current pathological fracture of vertebra, initial encounter (HCC) [M80.08XA]   Pre-op diagnosis: Age related osteoporosis with current pathological fracture vertebrae   [facility]: [facility] ROOM [facility] / [facility]   Surgeons: Mavis Purchase, MD       DISCUSSION: Patient is a 68 year old male scheduled for the above procedure.   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  He had pulmonology follow-up and preoperative evaluation by Dr. Theophilus on 07/25/2023. Part of assessment included: Severe COPD is well-controlled with no recent exacerbations. Last hospitalization was in March 2025. Oxygen saturation is in the mid-90s.  Recent CT scan shows no pneumonia but some mucus in the right lung, possibly due to aspiration. No current swallowing issues reported. Back pain may be affecting his ability to cough effectively. Continues to use oxygen, especially at night, to prevent strain on the heart. Smoking cessation is ongoing, which is beneficial for COPD management. Currently using a nebulizer with Otuvayre twice a day, which helps in lung function improvement. He is at mild to intermediate risk for surgery due to COPD, but the benefits of increased physical activity post-surgery outweigh the risks. - Continue using the nebulizer with Ohtuvayre  twice a day. - Order a home sleep study to evaluate oxygen levels and check for sleep apnea. - Continue chronic azithromycin  daily to prevent hospitalizations. - Send clearance letter to Dr. Mavis' office for surgery. - Order a second nebulizer for use at a different location. - Encourage continued smoking  cessation, especially until after surgery.  In regards to preoperative pulmonary assessment: Peri-operative Assessment of Pulmonary Risk for Non-Thoracic Surgery: Asked to provide pulmonary risk assessment for planned thoracic kyphoplasty   By ARISCAT criteria patient has  Low risk with 1.6% risk of in-hospital post-op pulmonary complications (composite including respiratory failure, respiratory infection, pleural effusion, atelectasis, pneumothorax, bronchospasm, aspiration pneumonitis)   ForMs. Lewis, risk of perioperative pulmonary complications is increased by:             Age greater than 65 years             COPD              Respiratory complications generally occur in 1% of ASA Class I patients, 5% of ASA Class II and 10% of ASA Class III-IV patients These complications rarely result in mortality and iclude postoperative pneumonia, atelectasis, pulmonary embolism, ARDS and increased time requiring postoperative mechanical ventilation.   Overall, I recommend proceeding with the surgery if the risk for respiratory complications are outweighed by the potential benefits. This will need to be discussed between the patient and surgeon.   To reduce risks of respiratory complications, I recommend: --Smoking cessation 6-8 weeks if possible --Pre- and post-operative incentive spirometry performed frequently while awake --Avoiding use of pancuronium during anesthesia.    He had cardiology evaluation by Dr. Liborio due to dyspnea. Echocardiogram in March 2025 at [facility] showed normal BiV function EF 65-70%, no significant valve abnormalities, diastolic function was indeterminate. March 2025 14 day event monitor showed predominantly SR, 6.6% supraventricular ectopy burden with longest run 19 beat, < 1.0% ventricular ectopy. Symptoms correlated mostly with SR and at times with isolated ventricular  and supraventricular ectopic beats. A CCTA was advised to evaluate for underlying CAD.  05/26/2023 CCTA showed CAD of 34 (31st percentile), mild non-obstructive CAD (25-49% ostial RCA; 0-24% OM1). Preventive therapy/risk factor modification advised.    VS: BP (!) 149/80   Pulse 83   Temp 36.7 C   Resp 17   Ht 5' 7 (1.702 m)   Wt 80.6 kg   SpO2 90%   BMI 27.82 kg/m   PROVIDERS: Loring Tanda Mae, MD is PCP  Madireddy, Alean, MD is cardiologist Mannam, Praveen, MD is pulmonologist   LABS: Labs reviewed: Acceptable for surgery. (all labs ordered are listed, but only abnormal results are displayed)  Labs Reviewed  BASIC METABOLIC PANEL WITH GFR - Abnormal; Notable for the following components:      Result Value   Potassium 3.4 (*)    All other components within normal limits  SURGICAL PCR SCREEN  CBC    PFTs 11/21/2021: FVC 2.59 [64%], FEV1 0.98 [32%], F/F38, TLC 8.41 (131%], DLCO 13.12 [54%] Severe obstruction with hyperinflation, air trapping Moderate diffusion defect   IMAGES: Chest CT High Resolution 07/18/2023: IMPRESSION: 1. No evidence of interstitial lung disease. 2. Endobronchial debris and mucoid impaction with scarring in the right lower lobe, findings suggesting chronic aspiration. 3. Cholelithiasis. 4. Right adrenal adenoma. 5. Gastric wall thickening, as on 05/11/2023. 6.  Aortic atherosclerosis (ICD10-I70.0). 7.  Emphysema (ICD10-J43.9).  MRI T-spine ([facility]): IMPRESSION:  Acute or subacute benign compression fracture of T6 with about 40% loss of vertebral height    EKG: 05/11/2023: NSR. Normal axis.    CV: CTA Coronary 05/26/2023: IMPRESSION: 1. Coronary calcium score of 34. This was 31st percentile for age-, sex, and race-matched controls. 2. Total plaque volume 38mm3 which is 9th percentile for age- and sex-matched controls (calcified plaque 82mm3; non-calcified plaque 53mm3). TPV is mild 3. Normal coronary origin with right dominance. 4. Nonobstructive CAD, with calcified plaque causing mild (25-49%) stenosis in  ostial RCA and noncalcified plaque causing minimal (0-24%) stenosis in OM1 RECOMMENDATIONS: CAD-RADS 2: Mild non-obstructive CAD (25-49%). Consider non-atherosclerotic causes of chest pain. Consider preventive therapy and risk factor modification.   Echocardiogram 04/21/2023 [facility]): Conclusions: LVEF 65-70%. LV diastolic function is undetermined.  RV global systolic function is normal. Trivial MR. Trivial TR. No pericardial effusion visualized.  RVSP cannot be estimated on this study.  - Comparison 08/16/2022: LVEF 71%, normal diastolic filling pattern, mild TR, no evidence of pulmonary hypertension   Long term Zio monitor 04/11/2023 - 04/25/2023:   Predominantly sinus rhythm with average heart rate 85/min [ranging from 47 to 128 bpm].  Supraventricular ectopy burden 6.6%.  Ventricular ectopy burden rare, less than 1%.  7 instances of supraventricular tachycardia noted with the longest episode lasting 19 beats.  Total patient triggered events 7 mostly correlated with sinus rhythm and at times with isolated ventricular and supraventricular ectopic beats.    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   Dyspnea    with exertion   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  GERD (gastroesophageal reflux disease) 03/28/2023   History of hemorrhoids 03/28/2023   Hx of adenomatous colonic polyps 01/04/2016   Hypertrophy of nasal turbinates    Insomnia 03/28/2023   Sinus disease    Smoker    SOB (shortness of breath)     Past Surgical History:  Procedure Laterality Date    CARPAL TUNNEL RELEASE Left    COLONOSCOPY     ETHMOIDECTOMY Bilateral 12/05/2015   Procedure: BILATERAL TOTAL  ETHMOIDECTOMY, MAXILLARY OSTEA  ENLARGEMENT;  Surgeon: Lonni FORBES Angle, MD;  Location: [facility];  Service: ENT;  Laterality: Bilateral;   EYE SURGERY Bilateral    cataract   HERNIA REPAIR     UHR   SINUS ENDO WITH FUSION Bilateral 12/05/2015   Procedure: FUCTIONAL  ENDOSCOPIC SINUS SURGERY WITH FUSION NAVIGATION;  Surgeon: Lonni FORBES Angle, MD;  Location: [facility];  Service: ENT;  Laterality: Bilateral;   TONSILLECTOMY     TURBINATE REDUCTION Bilateral 12/05/2015   Procedure: BILATERAL TURBINATE REDUCTION;  Surgeon: Lonni FORBES Angle, MD;  Location: [facility];  Service: ENT;  Laterality: Bilateral;   ULNAR NERVE REPAIR Left    decompression   WISDOM TOOTH EXTRACTION      MEDICATIONS:  albuterol  (VENTOLIN  HFA) 108 (90 Base) MCG/ACT inhaler   alendronate (FOSAMAX) 70 MG tablet   azithromycin  (ZITHROMAX ) 250 MG tablet   Budeson-Glycopyrrol-Formoterol  (BREZTRI  AEROSPHERE) 160-9-4.8 MCG/ACT AERO   Cholecalciferol (VITAMIN D3 MAXIMUM STRENGTH) 125 MCG (5000 UT) capsule   ferrous sulfate 325 (65 FE) MG EC tablet   furosemide (LASIX) 80 MG tablet   HYDROcodone -acetaminophen  (NORCO) 7.5-325 MG tablet   ibuprofen (ADVIL) 200 MG tablet   ipratropium-albuterol  (DUONEB) 0.5-2.5 (3) MG/3ML SOLN   OHTUVAYRE  3 MG/2.5ML SUSP   omeprazole  (PRILOSEC) 40 MG capsule   traZODone  (DESYREL ) 150 MG tablet   No current facility-administered medications for this encounter.    Isaiah Ruder, PA-C Surgical Short Stay/Anesthesiology [facility] Phone [phone] [facility] Phone [phone] 08/06/2023 4:58 PM

## 2023-08-06 NOTE — Progress Notes (Addendum)
 PCP - Dr Tanda Eke  Cardiologist - Dr Alean Madireddy  Chest x-ray - n/a EKG - 05/11/23 Stress Test - Yes, yrs ago, unknown location  ECHO - 04/21/23 Cardiac Cath - n/a  ICD Pacemaker/Loop - n/a  Sleep Study -  n/a  Diabetes - n/a  Aspirin  & Blood Thinner Instructions:  n/a  ERAS - clears til 11:50 AM on DOS  Anesthesia review: Yes, Isaiah, PA  STOP now taking any Aspirin  (unless otherwise instructed by your surgeon), Aleve, Naproxen, Ibuprofen, Motrin, Advil, Goody's, BC's, all herbal medications, fish oil, and all vitamins.   Coronavirus Screening Do you have any of the following symptoms:  Cough yes/no: No Fever (>100.60F)  yes/no: No Runny nose yes/no: No Sore throat yes/no: No Difficulty breathing/shortness of breath  yes/no: Yes-COPD  Have you traveled in the last 14 days and where? yes/no: No  Patient verbalized understanding of instructions that were given to them at the PAT appointment. Patient was also instructed that they will need to review over the PAT instructions again at home before surgery.

## 2023-08-06 NOTE — Anesthesia Preprocedure Evaluation (Signed)
 Anesthesia Evaluation  Patient identified by MRN, date of birth, ID band Patient awake    Reviewed: Allergy & Precautions, NPO status , Patient's Chart, lab work & pertinent test results  Airway Mallampati: II  TM Distance: >3 FB Neck ROM: Full    Dental no notable dental hx.    Pulmonary COPD, Patient abstained from smoking., former smoker   Pulmonary exam normal        Cardiovascular negative cardio ROS  Rhythm:Regular Rate:Normal  CTA Coronary 05/26/2023: IMPRESSION: 1. Coronary calcium score of 34. This was 31st percentile for age-, sex, and race-matched controls. 2. Total plaque volume 21mm3 which is 9th percentile for age- and sex-matched controls (calcified plaque 79mm3; non-calcified plaque 54mm3). TPV is mild 3. Normal coronary origin with right dominance. 4. Nonobstructive CAD, with calcified plaque causing mild (25-49%) stenosis in ostial RCA and noncalcified plaque causing minimal (0-24%) stenosis in OM1 RECOMMENDATIONS: CAD-RADS 2: Mild non-obstructive CAD (25-49%). Consider non-atherosclerotic causes of chest pain. Consider preventive therapy and risk factor modification.   Echocardiogram 04/21/2023 [facility]): Conclusions: LVEF 65-70%. LV diastolic function is undetermined.  RV global systolic function is normal. Trivial MR. Trivial TR. No pericardial effusion visualized.  RVSP cannot be estimated on this study.     Neuro/Psych   Anxiety     negative neurological ROS     GI/Hepatic Neg liver ROS,GERD  ,,  Endo/Other  negative endocrine ROS    Renal/GU negative Renal ROS  negative genitourinary   Musculoskeletal negative musculoskeletal ROS (+)    Abdominal Normal abdominal exam  (+)   Peds  Hematology  (+) Blood dyscrasia, anemia Lab Results      Component                Value               Date                      WBC                      9.6                 08/06/2023                 HGB                      14.6                08/06/2023                HCT                      44.4                08/06/2023                MCV                      95.7                08/06/2023                PLT                      351                 08/06/2023  Anesthesia Other Findings   Reproductive/Obstetrics                              Anesthesia Physical Anesthesia Plan  ASA: 3  Anesthesia Plan: General   Post-op Pain Management: Tylenol  PO (pre-op)*   Induction: Intravenous  PONV Risk Score and Plan: 2 and Ondansetron , Dexamethasone  and Treatment may vary due to age or medical condition  Airway Management Planned: Mask and Oral ETT  Additional Equipment: None  Intra-op Plan:   Post-operative Plan: Extubation in OR  Informed Consent: I have reviewed the patients History and Physical, chart, labs and discussed the procedure including the risks, benefits and alternatives for the proposed anesthesia with the patient or authorized representative who has indicated his/her understanding and acceptance.     Dental advisory given  Plan Discussed with: CRNA  Anesthesia Plan Comments: (PAT note written 08/06/2023 by Emalynn Clewis, PA-C. He had preoperative pulmonary evaluation by Dr. Theophilus. He is on chronic azithromycin  to prevent COPD hospitalizations.   )         Anesthesia Quick Evaluation

## 2023-08-07 ENCOUNTER — Ambulatory Visit (HOSPITAL_COMMUNITY): Admitting: Anesthesiology

## 2023-08-07 ENCOUNTER — Ambulatory Visit (HOSPITAL_COMMUNITY)
Admission: RE | Admit: 2023-08-07 | Discharge: 2023-08-07 | Disposition: A | Discharge status: Home / Self Care | Attending: Neurosurgery | Admitting: Neurosurgery

## 2023-08-07 ENCOUNTER — Encounter (HOSPITAL_COMMUNITY): Payer: Self-pay | Admitting: Vascular Surgery

## 2023-08-07 ENCOUNTER — Encounter (HOSPITAL_COMMUNITY)
Admission: RE | Disposition: A | Discharge status: Home / Self Care | Payer: Self-pay | Source: Home / Self Care | Attending: Neurosurgery

## 2023-08-07 ENCOUNTER — Encounter (HOSPITAL_COMMUNITY): Payer: Self-pay | Admitting: Neurosurgery

## 2023-08-07 ENCOUNTER — Other Ambulatory Visit: Payer: Self-pay

## 2023-08-07 ENCOUNTER — Ambulatory Visit (HOSPITAL_COMMUNITY)

## 2023-08-07 DIAGNOSIS — M8008XA Age-related osteoporosis with current pathological fracture, vertebra(e), initial encounter for fracture: Secondary | ICD-10-CM | POA: Diagnosis present

## 2023-08-07 DIAGNOSIS — Z79899 Other long term (current) drug therapy: Secondary | ICD-10-CM | POA: Insufficient documentation

## 2023-08-07 DIAGNOSIS — X500XXA Overexertion from strenuous movement or load, initial encounter: Secondary | ICD-10-CM | POA: Diagnosis not present

## 2023-08-07 DIAGNOSIS — J449 Chronic obstructive pulmonary disease, unspecified: Secondary | ICD-10-CM | POA: Insufficient documentation

## 2023-08-07 DIAGNOSIS — K219 Gastro-esophageal reflux disease without esophagitis: Secondary | ICD-10-CM | POA: Diagnosis not present

## 2023-08-07 DIAGNOSIS — Z7951 Long term (current) use of inhaled steroids: Secondary | ICD-10-CM | POA: Diagnosis not present

## 2023-08-07 DIAGNOSIS — M4854XA Collapsed vertebra, not elsewhere classified, thoracic region, initial encounter for fracture: Secondary | ICD-10-CM | POA: Diagnosis not present

## 2023-08-07 DIAGNOSIS — D649 Anemia, unspecified: Secondary | ICD-10-CM | POA: Diagnosis not present

## 2023-08-07 DIAGNOSIS — F419 Anxiety disorder, unspecified: Secondary | ICD-10-CM | POA: Insufficient documentation

## 2023-08-07 DIAGNOSIS — J441 Chronic obstructive pulmonary disease with (acute) exacerbation: Secondary | ICD-10-CM | POA: Diagnosis not present

## 2023-08-07 DIAGNOSIS — Z87891 Personal history of nicotine dependence: Secondary | ICD-10-CM

## 2023-08-07 HISTORY — PX: KYPHOPLASTY: SHX5884

## 2023-08-07 SURGERY — KYPHOPLASTY
Anesthesia: General

## 2023-08-07 MED ORDER — BUPIVACAINE-EPINEPHRINE (PF) 0.5% -1:200000 IJ SOLN
INTRAMUSCULAR | Status: AC
  Filled 2023-08-07: qty 30

## 2023-08-07 MED ORDER — PROPOFOL 10 MG/ML IV BOLUS
INTRAVENOUS | Status: AC
  Filled 2023-08-07: qty 20

## 2023-08-07 MED ORDER — BACITRACIN ZINC 500 UNIT/GM EX OINT
TOPICAL_OINTMENT | CUTANEOUS | Status: AC
  Filled 2023-08-07: qty 28.35

## 2023-08-07 MED ORDER — ALBUTEROL SULFATE HFA 108 (90 BASE) MCG/ACT IN AERS
INHALATION_SPRAY | RESPIRATORY_TRACT | Status: AC
  Filled 2023-08-07: qty 6.7

## 2023-08-07 MED ORDER — FENTANYL CITRATE (PF) 250 MCG/5ML IJ SOLN
INTRAMUSCULAR | Status: AC
  Filled 2023-08-07: qty 5

## 2023-08-07 MED ORDER — ONDANSETRON HCL 4 MG/2ML IJ SOLN
INTRAMUSCULAR | Status: DC | PRN
Start: 2023-08-07 — End: 2023-08-07
  Administered 2023-08-07: 4 mg via INTRAVENOUS

## 2023-08-07 MED ORDER — MIDAZOLAM HCL 2 MG/2ML IJ SOLN
INTRAMUSCULAR | Status: DC | PRN
  Administered 2023-08-07: 2 mg via INTRAVENOUS

## 2023-08-07 MED ORDER — CHLORHEXIDINE GLUCONATE CLOTH 2 % EX PADS: 6.0000 | MEDICATED_PAD | Freq: Once | CUTANEOUS | Status: DC

## 2023-08-07 MED ORDER — ONDANSETRON HCL 4 MG/2ML IJ SOLN
INTRAMUSCULAR | Status: AC
  Filled 2023-08-07: qty 2

## 2023-08-07 MED ORDER — CHLORHEXIDINE GLUCONATE 0.12 % MT SOLN
15.0000 mL | Freq: Once | OROMUCOSAL | Status: AC
Start: 2023-08-07 — End: 2023-08-07
  Administered 2023-08-07: 15 mL via OROMUCOSAL
  Filled 2023-08-07: qty 15

## 2023-08-07 MED ORDER — ORAL CARE MOUTH RINSE: 15.0000 mL | Freq: Once | OROMUCOSAL | Status: AC

## 2023-08-07 MED ORDER — OXYCODONE HCL 5 MG/5ML PO SOLN: 5.0000 mg | Freq: Once | ORAL | Status: AC | PRN

## 2023-08-07 MED ORDER — SUGAMMADEX SODIUM 200 MG/2ML IV SOLN
INTRAVENOUS | Status: DC | PRN
  Administered 2023-08-07: 160.6 mg via INTRAVENOUS

## 2023-08-07 MED ORDER — OXYCODONE HCL 5 MG PO TABS
5.0000 mg | ORAL_TABLET | Freq: Once | ORAL | Status: AC | PRN
  Administered 2023-08-07: 5 mg via ORAL

## 2023-08-07 MED ORDER — ALBUTEROL SULFATE HFA 108 (90 BASE) MCG/ACT IN AERS
INHALATION_SPRAY | RESPIRATORY_TRACT | Status: DC | PRN
  Administered 2023-08-07: 8 via RESPIRATORY_TRACT

## 2023-08-07 MED ORDER — 0.9 % SODIUM CHLORIDE (POUR BTL) OPTIME
TOPICAL | Status: DC | PRN
  Administered 2023-08-07: 1000 mL

## 2023-08-07 MED ORDER — IOPAMIDOL (ISOVUE-300) INJECTION 61%
INTRAVENOUS | Status: DC | PRN
  Administered 2023-08-07: 100 mL

## 2023-08-07 MED ORDER — ACETAMINOPHEN 500 MG PO TABS
1000.0000 mg | ORAL_TABLET | Freq: Once | ORAL | Status: AC
  Administered 2023-08-07: 500 mg via ORAL
  Filled 2023-08-07: qty 2

## 2023-08-07 MED ORDER — ROCURONIUM BROMIDE 100 MG/10ML IV SOLN
INTRAVENOUS | Status: DC | PRN
  Administered 2023-08-07: 60 mg via INTRAVENOUS

## 2023-08-07 MED ORDER — MIDAZOLAM HCL 2 MG/2ML IJ SOLN
INTRAMUSCULAR | Status: AC
  Filled 2023-08-07: qty 2

## 2023-08-07 MED ORDER — LACTATED RINGERS IV SOLN: INTRAVENOUS | Status: DC

## 2023-08-07 MED ORDER — FENTANYL CITRATE (PF) 100 MCG/2ML IJ SOLN: 25.0000 ug | INTRAMUSCULAR | Status: DC | PRN

## 2023-08-07 MED ORDER — LIDOCAINE 2% (20 MG/ML) 5 ML SYRINGE
INTRAMUSCULAR | Status: AC
  Filled 2023-08-07: qty 5

## 2023-08-07 MED ORDER — DEXAMETHASONE SODIUM PHOSPHATE 10 MG/ML IJ SOLN
INTRAMUSCULAR | Status: DC | PRN
  Administered 2023-08-07: 10 mg via INTRAVENOUS

## 2023-08-07 MED ORDER — FENTANYL CITRATE (PF) 250 MCG/5ML IJ SOLN
INTRAMUSCULAR | Status: DC | PRN
  Administered 2023-08-07: 50 ug via INTRAVENOUS
  Administered 2023-08-07: 100 ug via INTRAVENOUS

## 2023-08-07 MED ORDER — DROPERIDOL 2.5 MG/ML IJ SOLN: 0.6250 mg | Freq: Once | INTRAMUSCULAR | Status: DC | PRN

## 2023-08-07 MED ORDER — ROCURONIUM BROMIDE 10 MG/ML (PF) SYRINGE
PREFILLED_SYRINGE | INTRAVENOUS | Status: AC
Start: 2023-08-07 — End: 2023-08-07
  Filled 2023-08-07: qty 10

## 2023-08-07 MED ORDER — OXYCODONE HCL 5 MG PO TABS
ORAL_TABLET | ORAL | Status: AC
  Filled 2023-08-07: qty 1

## 2023-08-07 MED ORDER — PROPOFOL 10 MG/ML IV BOLUS
INTRAVENOUS | Status: DC | PRN
  Administered 2023-08-07: 160 mg via INTRAVENOUS

## 2023-08-07 MED ORDER — SUGAMMADEX SODIUM 200 MG/2ML IV SOLN
INTRAVENOUS | Status: AC
  Filled 2023-08-07: qty 2

## 2023-08-07 MED ORDER — CEFAZOLIN SODIUM-DEXTROSE 2-4 GM/100ML-% IV SOLN
2.0000 g | INTRAVENOUS | Status: AC
  Administered 2023-08-07: 2 g via INTRAVENOUS
  Filled 2023-08-07: qty 100

## 2023-08-07 MED ORDER — BUPIVACAINE-EPINEPHRINE 0.5% -1:200000 IJ SOLN
INTRAMUSCULAR | Status: DC | PRN
  Administered 2023-08-07: 10 mL

## 2023-08-07 MED ORDER — DEXAMETHASONE SODIUM PHOSPHATE 10 MG/ML IJ SOLN
INTRAMUSCULAR | Status: AC
Start: 2023-08-07 — End: 2023-08-07
  Filled 2023-08-07: qty 1

## 2023-08-07 MED ORDER — LIDOCAINE 2% (20 MG/ML) 5 ML SYRINGE
INTRAMUSCULAR | Status: DC | PRN
Start: 2023-08-07 — End: 2023-08-07
  Administered 2023-08-07: 60 mg via INTRAVENOUS

## 2023-08-07 SURGICAL SUPPLY — 37 items
BAG COUNTER SPONGE SURGICOUNT (BAG) ×1 IMPLANT
BENZOIN TINCTURE PRP APPL 2/3 (GAUZE/BANDAGES/DRESSINGS) ×1 IMPLANT
BLADE CLIPPER SURG (BLADE) IMPLANT
BLADE SURG 15 STRL LF DISP TIS (BLADE) ×1 IMPLANT
CEMENT KYPHON C01A KIT/MIXER (Cement) IMPLANT
DRAPE C-ARM 42X72 X-RAY (DRAPES) ×1 IMPLANT
DRAPE HALF SHEET 40X57 (DRAPES) ×1 IMPLANT
DRAPE INCISE IOBAN 66X45 STRL (DRAPES) ×1 IMPLANT
DRAPE LAPAROTOMY 100X72X124 (DRAPES) ×1 IMPLANT
DRAPE SURG 17X23 STRL (DRAPES) ×4 IMPLANT
DRAPE WARM FLUID 44X44 (DRAPES) ×1 IMPLANT
DRSG OPSITE POSTOP 4X6 (GAUZE/BANDAGES/DRESSINGS) IMPLANT
GAUZE 4X4 16PLY ~~LOC~~+RFID DBL (SPONGE) ×1 IMPLANT
GAUZE SPONGE 4X4 12PLY STRL (GAUZE/BANDAGES/DRESSINGS) ×1 IMPLANT
GLOVE BIO SURGEON STRL SZ8 (GLOVE) ×1 IMPLANT
GLOVE BIO SURGEON STRL SZ8.5 (GLOVE) ×1 IMPLANT
GLOVE EXAM NITRILE XL STR (GLOVE) IMPLANT
GOWN STRL REUS W/ TWL LRG LVL3 (GOWN DISPOSABLE) IMPLANT
GOWN STRL REUS W/ TWL XL LVL3 (GOWN DISPOSABLE) IMPLANT
KIT BASIN OR (CUSTOM PROCEDURE TRAY) ×1 IMPLANT
KIT POSITIONER JACKSON TABLE (MISCELLANEOUS) ×1 IMPLANT
KIT TURNOVER KIT B (KITS) ×1 IMPLANT
NDL HYPO 22X1.5 SAFETY MO (MISCELLANEOUS) ×1 IMPLANT
NEEDLE HYPO 22X1.5 SAFETY MO (MISCELLANEOUS) ×1 IMPLANT
NS IRRIG 1000ML POUR BTL (IV SOLUTION) ×1 IMPLANT
PACK EENT II TURBAN DRAPE (CUSTOM PROCEDURE TRAY) ×1 IMPLANT
PAD ARMBOARD POSITIONER FOAM (MISCELLANEOUS) ×3 IMPLANT
PATTIES SURGICAL .5 X.5 (GAUZE/BANDAGES/DRESSINGS) ×1 IMPLANT
PATTIES SURGICAL 1X1 (DISPOSABLE) ×1 IMPLANT
SPECIMEN JAR SMALL (MISCELLANEOUS) IMPLANT
STRIP CLOSURE SKIN 1/2X4 (GAUZE/BANDAGES/DRESSINGS) ×1 IMPLANT
SUT VIC AB 3-0 SH 8-18 (SUTURE) ×1 IMPLANT
SYR 30ML SLIP (SYRINGE) ×1 IMPLANT
SYR CONTROL 10ML LL (SYRINGE) ×1 IMPLANT
TOWEL GREEN STERILE (TOWEL DISPOSABLE) ×1 IMPLANT
TOWEL GREEN STERILE FF (TOWEL DISPOSABLE) ×1 IMPLANT
TRAY KYPHOPAK 15/3 ONESTEP 1ST (MISCELLANEOUS) IMPLANT

## 2023-08-07 NOTE — H&P (Signed)
 Subjective: The patient is osteoporotic 68 year old white male with COPD who had acute onset of back pain after lifting a heavy box.  He was found to have a T6 compression fracture.  I discussed the various treatment options with him.  He has decided proceed with a kyphoplasty.  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   Dyspnea    with exertion   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   GERD (gastroesophageal reflux disease) 03/28/2023   History of hemorrhoids 03/28/2023   Hx of adenomatous colonic polyps 01/04/2016   Hypertrophy of nasal turbinates    Insomnia 03/28/2023   Sinus disease    Smoker    SOB (shortness of breath)     Past Surgical History:  Procedure Laterality Date   CARPAL TUNNEL RELEASE Left    COLONOSCOPY     ETHMOIDECTOMY Bilateral 12/05/2015   Procedure: BILATERAL TOTAL  ETHMOIDECTOMY, MAXILLARY OSTEA  ENLARGEMENT;  Surgeon: Lonni FORBES Angle, MD;  Location: [facility];  Service: ENT;  Laterality: Bilateral;   EYE SURGERY Bilateral    cataract   HERNIA REPAIR     UHR   SINUS ENDO WITH FUSION Bilateral 12/05/2015   Procedure: FUCTIONAL  ENDOSCOPIC SINUS SURGERY WITH FUSION NAVIGATION;  Surgeon: Lonni FORBES Angle, MD;  Location: [facility];  Service: ENT;  Laterality: Bilateral;   TONSILLECTOMY     TURBINATE REDUCTION Bilateral 12/05/2015   Procedure: BILATERAL TURBINATE REDUCTION;  Surgeon: Lonni FORBES Angle, MD;  Location: [facility];  Service: ENT;  Laterality: Bilateral;   ULNAR NERVE REPAIR Left     decompression   WISDOM TOOTH EXTRACTION      Allergies  Allergen Reactions   Penicillins Rash    Social History   Tobacco Use   Smoking status: Former    Current packs/day: 0.00    Types: Cigarettes    Quit date: 09/1970    Years since quitting: 52.8   Smokeless tobacco: Never   Tobacco comments:    Quit smoking in April 2025.  Substance Use Topics   Alcohol use: Yes    Comment: once monthly, beer/mixed drink    Family History  Problem Relation Age of Onset   Liver cancer Mother    Colon polyps Father    Ehlers-Danlos syndrome Sister    Colon cancer Neg Hx    Stomach cancer Neg Hx    Rectal cancer Neg Hx    Pancreatic cancer Neg Hx    Prior to Admission medications   Medication Sig Start Date End Date Taking? Authorizing Provider  albuterol  (VENTOLIN  HFA) 108 (90 Base) MCG/ACT inhaler Inhale 2 puffs into the lungs every 6 (six) hours as needed for wheezing or shortness of breath (Use when out of your home or if unable to reach your nebulizer machine.). 03/29/23  Yes Briana Elgin LABOR, MD  alendronate (FOSAMAX) 70 MG tablet Take 70 mg by mouth every Monday. 06/26/23  Yes [provider]  azithromycin  (ZITHROMAX ) 250 MG tablet Take 1 tablet (250 mg total) by  mouth daily. 06/02/23 06/01/24 Yes Mannam, Praveen, MD  Budeson-Glycopyrrol-Formoterol  (BREZTRI  AEROSPHERE) 160-9-4.8 MCG/ACT AERO Inhale 2 puffs into the lungs every 12 (twelve) hours. 07/22/22  Yes   Cholecalciferol (VITAMIN D3 MAXIMUM STRENGTH) 125 MCG (5000 UT) capsule Take 5,000 Units by mouth daily.   Yes [provider]  ferrous sulfate 325 (65 FE) MG EC tablet Take 325 mg by mouth daily.   Yes [provider]  furosemide (LASIX) 80 MG tablet Take 160 mg by mouth 2 (two) times daily. 04/14/23  Yes [provider]  HYDROcodone -acetaminophen  (NORCO) 7.5-325 MG tablet Take 1 tablet by mouth every 4 (four) hours as needed for severe pain (pain score 7-10). 07/28/23  Yes [provider]   ibuprofen (ADVIL) 200 MG tablet Take 400 mg by mouth every 6 (six) hours as needed for moderate pain (pain score 4-6).   Yes [provider]  ipratropium-albuterol  (DUONEB) 0.5-2.5 (3) MG/3ML SOLN Take 3 mLs by nebulization every 4 (four) hours as needed (shortness of breath or wheezing. Use when at home.). 03/29/23  Yes Briana Elgin LABOR, MD  OHTUVAYRE  3 MG/2.5ML SUSP Inhale 2.5 mLs into the lungs 2 (two) times daily. 07/28/23  Yes [provider]  omeprazole  (PRILOSEC) 40 MG capsule Take 1 capsule (40 mg total) by mouth daily. 07/11/23  Yes Avram Lupita BRAVO, MD  traZODone  (DESYREL ) 150 MG tablet Take 0.5-1 tablets (75-150 mg total) by mouth at bedtime for sleep or mood. Patient taking differently: Take 75 mg by mouth at bedtime. 12/05/22  Yes      Review of Systems  Positive ROS: As above  All other systems have been reviewed and were otherwise negative with the exception of those mentioned in the HPI and as above.  Objective: Vital signs in last 24 hours: Temp:  [98.1 F (36.7 C)] 98.1 F (36.7 C) (07/10 1212) Pulse Rate:  [70] 70 (07/10 1212) Resp:  [18] 18 (07/10 1212) BP: (125)/(72) 125/72 (07/10 1212) SpO2:  [94 %] 94 % (07/10 1212) Weight:  [80.3 kg] 80.3 kg (07/10 1212) Estimated body mass index is 27.72 kg/m as calculated from the following:   Height as of this encounter: 5' 7 (1.702 m).   Weight as of this encounter: 80.3 kg.   General Appearance: Alert Head: Normocephalic, without obvious abnormality, atraumatic Eyes: PERRL, conjunctiva/corneas clear, EOM's intact,    Ears: Normal  Throat: Normal  Neck: Supple, Back: Mid thoracic tenderness Lungs: Clear to auscultation bilaterally, respirations unlabored Heart: Regular rate and rhythm, no murmur, rub or gallop Abdomen: Soft, non-tender Extremities: Extremities normal, atraumatic, no cyanosis or edema Skin: unremarkable  NEUROLOGIC:   Mental status: alert and oriented,Motor Exam - grossly  normal Sensory Exam - grossly normal Reflexes:  Coordination - grossly normal Gait - grossly normal Balance - grossly normal Cranial Nerves: I: smell Not tested  II: visual acuity  OS: Normal  OD: Normal   II: visual fields Full to confrontation  II: pupils Equal, round, reactive to light  III,VII: ptosis None  III,IV,VI: extraocular muscles  Full ROM  V: mastication Normal  V: facial light touch sensation  Normal  V,VII: corneal reflex  Present  VII: facial muscle function - upper  Normal  VII: facial muscle function - lower Normal  VIII: hearing Not tested  IX: soft palate elevation  Normal  IX,X: gag reflex Present  XI: trapezius strength  5/5  XI: sternocleidomastoid strength 5/5  XI: neck flexion strength  5/5  XII: tongue strength  Normal  Data Review Lab Results  Component Value Date   WBC 9.6 08/06/2023   HGB 14.6 08/06/2023   HCT 44.4 08/06/2023   MCV 95.7 08/06/2023   PLT 351 08/06/2023   Lab Results  Component Value Date   NA 142 08/06/2023   K 3.4 (L) 08/06/2023   CL 101 08/06/2023   CO2 31 08/06/2023   BUN 17 08/06/2023   CREATININE 0.87 08/06/2023   GLUCOSE 97 08/06/2023   No results found for: INR, PROTIME  Assessment/Plan: C6 compression fracture, osteoporosis, thoracic spine pain: I have discussed the situation with the patient.  I reviewed his imaging studies with him and pointed out that he abnormalities.  We discussed the various treatment options including surgery.  I described the surgical treatment option of a T6 kyphoplasty.  I have shown him surgical models.  I have given him a surgical pamphlet.  We have discussed the risk, benefits, alternatives, expected postoperative course, and likelihood of achieving our goals with surgery.  I have answered all the patient's questions.  He has decided proceed with surgery.   Reyes JONETTA Budge 08/07/2023 2:00 PM

## 2023-08-07 NOTE — Transfer of Care (Signed)
 Immediate Anesthesia Transfer of Care Note  Patient: Austin Glass  Procedure(s) Performed: KYPHOPLASTY THORACIC SIX  Patient Location: PACU  Anesthesia Type:General  Level of Consciousness: awake, alert , and oriented  Airway & Oxygen Therapy: Patient Spontanous Breathing and Patient connected to face mask oxygen  Post-op Assessment: Report given to RN, Post -op Vital signs reviewed and stable, Patient moving all extremities X 4, and Patient able to stick tongue midline  Post vital signs: Reviewed and stable  Last Vitals:  Vitals Value Taken Time  BP 135/83 08/07/23 15:45  Temp 97.8   Pulse 73 08/07/23 15:46  Resp 17 08/07/23 15:46  SpO2 100 % 08/07/23 15:46  Vitals shown include unfiled device data.  Last Pain:  Vitals:   08/07/23 1230  TempSrc:   PainSc: 7          Complications: No notable events documented.

## 2023-08-07 NOTE — Anesthesia Postprocedure Evaluation (Signed)
 Anesthesia Post Note  Patient: ISAEL STILLE  Procedure(s) Performed: KYPHOPLASTY THORACIC SIX     Patient location during evaluation: PACU Anesthesia Type: General Level of consciousness: awake and alert Pain management: pain level controlled Vital Signs Assessment: post-procedure vital signs reviewed and stable Respiratory status: spontaneous breathing, nonlabored ventilation, respiratory function stable and patient connected to nasal cannula oxygen Cardiovascular status: blood pressure returned to baseline and stable Postop Assessment: no apparent nausea or vomiting Anesthetic complications: no   No notable events documented.  Last Vitals:  Vitals:   08/07/23 1600 08/07/23 1615  BP: (!) 140/74 131/77  Pulse: 72 72  Resp: 17 16  Temp:  36.6 C  SpO2: 94% 93%    Last Pain:  Vitals:   08/07/23 1615  TempSrc:   PainSc: 4     LLE Motor Response: Responds to commands (08/07/23 1615) LLE Sensation: Full sensation (08/07/23 1615) RLE Motor Response: Responds to commands (08/07/23 1615) RLE Sensation: Full sensation (08/07/23 1615)      Cordella P Brinley Treanor

## 2023-08-07 NOTE — Anesthesia Procedure Notes (Signed)
 Procedure Name: Intubation Date/Time: 08/07/2023 2:49 PM  Performed by: Harrold Macintosh, CRNAPre-anesthesia Checklist: Patient identified, Emergency Drugs available, Suction available and Patient being monitored Patient Re-evaluated:Patient Re-evaluated prior to induction Oxygen Delivery Method: Circle system utilized Preoxygenation: Pre-oxygenation with 100% oxygen Induction Type: IV induction Ventilation: Mask ventilation without difficulty Laryngoscope Size: Miller and 2 Grade View: Grade I Tube type: Oral Tube size: 7.5 mm Number of attempts: 1 Airway Equipment and Method: Stylet and Bite block Placement Confirmation: ETT inserted through vocal cords under direct vision, positive ETCO2 and breath sounds checked- equal and bilateral Secured at: 23 cm Tube secured with: Tape Dental Injury: Teeth and Oropharynx as per pre-operative assessment

## 2023-08-07 NOTE — Op Note (Signed)
 Brief history:  The patient is a 68 year old white male with osteoporosis who suffered a T6 compression fracture.  I discussed the various treatment options with him.  He has decided proceed with surgery.    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 Chyrl Budge  Assistant:  None   Anesthesia: General tracheal  Estimated blood loss:  Minimal   Specimens:  None   Complications:  None  Description of procedure: The patient was brought to the operating room by the anesthesia team.  General endotracheal anesthesia was induced.  He was turned to the prone position on the chest rolls.  His thoracic region was then prepared with Betadine scrub and Betadine solution.  Sterile drapes were applied.  I injected the areas to be incised with Marcaine  with epinephrine  solution.  I used a scalpel to make a small incision of the patient bilateral T6 pedicles.  Under biplanar fluoroscopy I cannulated the bilateral T6 pedicles with the Jamshidi needle.  I then removed the stylet, drilled down the cannula, removed the drill, inserted the balloons, inflated the balloons, deflated the balloons and then removed the balloons.  I then injected bone cement under fluoroscopic guidance into the T6 vertebrae.  After I was satisfied with the fill I removed the cannulas.  I then reapproximated the patient's subcutaneous tissue with her good thrill Vicryl suture.  I reapproximated the patient's skin with Steri-Strips and benzoin.  The wound was then coated with bacitracin  ointment.  A sterile dressing was applied.  The drapes were removed.  By report all sponge, instrument, and needle counts were correct at the end of this case.

## 2023-08-08 ENCOUNTER — Encounter (HOSPITAL_COMMUNITY): Payer: Self-pay | Admitting: Neurosurgery

## 2023-08-19 ENCOUNTER — Ambulatory Visit

## 2023-08-19 ENCOUNTER — Encounter

## 2023-08-19 DIAGNOSIS — R0600 Dyspnea, unspecified: Secondary | ICD-10-CM

## 2023-08-27 ENCOUNTER — Other Ambulatory Visit (HOSPITAL_COMMUNITY): Payer: Self-pay

## 2023-08-28 ENCOUNTER — Other Ambulatory Visit (HOSPITAL_COMMUNITY): Payer: Self-pay

## 2023-08-28 MED ORDER — BUDESON-GLYCOPYRROL-FORMOTEROL 160-9-4.8 MCG/ACT IN AERO
2.0000 | INHALATION_SPRAY | Freq: Two times a day (BID) | RESPIRATORY_TRACT | 0 refills | Status: AC
  Filled 2023-08-28 – 2024-02-18 (×15): qty 32.1, 90d supply, fill #0

## 2023-09-04 ENCOUNTER — Encounter: Payer: Self-pay | Admitting: Podiatry

## 2023-09-04 ENCOUNTER — Ambulatory Visit: Admitting: Podiatry

## 2023-09-04 VITALS — Ht 67.0 in | Wt 177.0 lb

## 2023-09-04 DIAGNOSIS — M79675 Pain in left toe(s): Secondary | ICD-10-CM

## 2023-09-04 DIAGNOSIS — M79674 Pain in right toe(s): Secondary | ICD-10-CM

## 2023-09-04 DIAGNOSIS — B351 Tinea unguium: Secondary | ICD-10-CM

## 2023-09-04 NOTE — Progress Notes (Signed)
 Subjective: Chief Complaint  Patient presents with   Nail Problem    Pt is here for RFC.    68 year old male presents for above concerns.  States his nails are thickened elongated he could not do them himself now causing discomfort.  He said he canceled his appointments due to illness.  He was using topical compound cream through [employer].  He is previously on Lamisil .   Objective: AAO x3, NAD DP/PT pulses palpable bilaterally, CRT less than 3 seconds Toenails remains hypertrophic, dystrophic with yellow, brown discoloration.  About debrided with a having not seen much improvement.  He has tenderness nails 1-5 bilaterally.  Some splitting of the nails of the distal aspect of the hallux toenails. No pain with calf compression, swelling, warmth, erythema  Assessment: Symptomatic onychomycosis  Plan: -All treatment options discussed with the patient including all alternatives, risks, complications.  -Sharply debrided the nails x10 without any complications or bleeding today.   -Discussed further treatment options.  Discussed continue the topical compound cream.  We did discuss removal of the hallux toenails if needed as well.   Return in about 3 months (around 12/05/2023) for nail trim.  Donnice JONELLE Fees DPM

## 2023-09-09 ENCOUNTER — Telehealth: Payer: Self-pay

## 2023-09-09 NOTE — Telephone Encounter (Signed)
 Pt's wife, [name]) called on behalf of the pt and stated that they never received the HST results. I do not see this in the pt's chart. PCC's, are you able to locate this and have it scanned in the pt's chart?

## 2023-09-10 NOTE — Telephone Encounter (Signed)
 LMTCB unsure of what is being requested. When did pt do sleep study? No results scanned into chart yet.

## 2023-09-10 NOTE — Telephone Encounter (Signed)
 Patient's HST Snap study is pending signature from Dr. Jude on [facility]. Dr. Alva is currently on Vacation until 09/12/23. Patient would need to wait till then.

## 2023-09-10 NOTE — Telephone Encounter (Signed)
 Yes, he can sign it when he comes back.

## 2023-09-14 DIAGNOSIS — G4734 Idiopathic sleep related nonobstructive alveolar hypoventilation: Secondary | ICD-10-CM | POA: Diagnosis not present

## 2023-09-17 ENCOUNTER — Telehealth: Payer: Self-pay | Admitting: *Deleted

## 2023-09-17 DIAGNOSIS — G4733 Obstructive sleep apnea (adult) (pediatric): Secondary | ICD-10-CM

## 2023-09-17 NOTE — Telephone Encounter (Signed)
 Please let patient know that sleep study shows mild sleep apnea and low O2 levels at night Order auto CPAP 5-15 cm of water along with his usual 2 L oxygen at night.

## 2023-09-17 NOTE — Telephone Encounter (Signed)
 Copied from CRM #[id number]. Topic: Clinical - Lab/Test Results >> Sep 16, 2023  1:26 PM Celestine FALCON wrote: Reason for CRM: Pt's spouse on DPR [name] is calling for the pt's HST results. There was an encounter from 8/13 stating that Dr. Theophilus needs a signature on [facility] and it would be completed when he returned on 09/12/2023, but it still hasn't been released to the pt.  Pt would like to see if Dr. Theophilus would be able to complete this today and have a CMA call the pt's spouse [name] at [phone] ok to leave a vm.   Dr Theophilus, the HST results are uploaded now under procedures tab. Please review the results and advise, thanks! Pt's next ov 10/30/23.

## 2023-09-18 ENCOUNTER — Other Ambulatory Visit (HOSPITAL_COMMUNITY): Payer: Self-pay

## 2023-09-18 NOTE — Telephone Encounter (Signed)
 Called and spoke with patient on results,placed order.NFN

## 2023-09-19 ENCOUNTER — Other Ambulatory Visit: Payer: Self-pay | Admitting: Vascular Surgery

## 2023-09-19 ENCOUNTER — Other Ambulatory Visit: Payer: Self-pay

## 2023-09-19 DIAGNOSIS — M7989 Other specified soft tissue disorders: Secondary | ICD-10-CM

## 2023-09-29 ENCOUNTER — Other Ambulatory Visit: Payer: Self-pay

## 2023-10-14 NOTE — Progress Notes (Unsigned)
 Office Note     CC: Bilateral extremity leg swelling, right greater than left Requesting Provider:  Loring Tanda Glass, *  HPI: Austin Glass is a 68 y.o. (02-24-1955) male who presents at the request of Austin Tanda Mae, MD for evaluation of bilateral lower extremity leg swelling right greater than left.  On exam, Austin Glass is doing well, accompanied by his wife.  A native of [place], he and his wife now live in [place].  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  Roughly 8 to 10 months ago, Austin Glass appreciated severe bilateral lower extremity edema.  This onset was acute, and most appreciated in the right lower extremity.  The edema was noted from the tips of the toes up to the knees.  He was trialed on Lasix medication, however had very little benefit.  He tried compression but stated the socks were too tight. Swelling is worse by the end of the day.  In the morning, his legs are relatively normal in size.  Denies ulceration, bleeding, but states that the legs do get tired, and can throb by days end.  No history of wounds.  No history of venous procedures or DVT. Per patient, recent echo demonstrated no heart failure.   Most recent labs demonstrated no kidney disease.  States he was bitten by a tick roughly 2 years ago, and appreciated a raised, red spot.  Denied fevers, chills, denied night sweats, but noted that it took roughly a year for the lesion to resolve.  Timeline of symptoms occurred after multiple bouts of pneumonia which were treated with antibiotics.  Past Medical History:  Diagnosis Date   Anemia    hx per pt on 08/06/23   Anxiety    Atypical chest pain 04/11/2023   Hx - no current problem per pt on 08/06/23   CAP (community acquired pneumonia) 03/28/2023   x several times - last was on 03/28/23   Continuous dependence on cigarette smoking 03/28/2023   COPD exacerbation (HCC) 03/28/2023   COPD with acute exacerbation (HCC) 03/28/2023   COPD  with chronic bronchitis and emphysema (HCC) 12/31/2021   Dyspnea    with exertion   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   GERD (gastroesophageal reflux disease) 03/28/2023   History of hemorrhoids 03/28/2023   Hx of adenomatous colonic polyps 01/04/2016   Hypertrophy of nasal turbinates    Insomnia 03/28/2023   Sinus disease    Smoker    SOB (shortness of breath)     Past Surgical History:  Procedure Laterality Date   CARPAL TUNNEL RELEASE Left    COLONOSCOPY     ETHMOIDECTOMY Bilateral 12/05/2015   Procedure: BILATERAL TOTAL  ETHMOIDECTOMY, MAXILLARY OSTEA  ENLARGEMENT;  Surgeon: Lonni FORBES Angle, MD;  Location: [facility];  Service: ENT;  Laterality: Bilateral;   EYE SURGERY Bilateral    cataract   HERNIA REPAIR     UHR   KYPHOPLASTY N/A 08/07/2023   Procedure: KYPHOPLASTY THORACIC SIX;  Surgeon: Mavis Purchase, MD;  Location: [facility] OR;  Service: Neurosurgery;  Laterality: N/A;  Kyphoplasty - T6   SINUS ENDO WITH FUSION Bilateral 12/05/2015   Procedure: FUCTIONAL  ENDOSCOPIC SINUS SURGERY WITH FUSION NAVIGATION;  Surgeon: Lonni FORBES Angle, MD;  Location: [facility];  Service: ENT;  Laterality: Bilateral;   TONSILLECTOMY     TURBINATE REDUCTION Bilateral 12/05/2015   Procedure: BILATERAL TURBINATE REDUCTION;  Surgeon: Lonni FORBES Angle, MD;  Location: [facility];  Service: ENT;  Laterality: Bilateral;   ULNAR NERVE REPAIR Left    decompression   WISDOM TOOTH EXTRACTION      Social History   Socioeconomic History   Marital status: Divorced    Spouse name: Not on file   Number of children: 1   Years of education: Not on file   Highest education level: Not on file  Occupational History   Occupation: retired  Tobacco Use   Smoking status: Former    Current packs/day: 0.00    Types: Cigarettes    Quit date: 09/1970    Years since quitting: 53.0   Smokeless tobacco: Never   Tobacco comments:    Quit smoking in April  2025.  Vaping Use   Vaping status: Never Used  Substance and Sexual Activity   Alcohol use: Yes    Comment: once monthly, beer/mixed drink   Drug use: No   Sexual activity: Yes  Other Topics Concern   Not on file  Social History Narrative   Married, 1 grown child   still smokes a few cigarettes a day   Alcohol once a month no drug use   Social Drivers of Corporate investment banker Strain: Not on file  Food Insecurity: No Food Insecurity (03/29/2023)   Hunger Vital Sign    Worried About Running Out of Food in the Last Year: Never true    Ran Out of Food in the Last Year: Never true  Transportation Needs: No Transportation Needs (03/29/2023)   PRAPARE - Administrator, Civil Service (Medical): No    Lack of Transportation (Non-Medical): No  Physical Activity: Not on file  Stress: Not on file  Social Connections: Moderately Integrated (03/29/2023)   Social Connection and Isolation Panel    Frequency of Communication with Friends and Family: More than three times a week    Frequency of Social Gatherings with Friends and Family: Once a week    Attends Religious Services: More than 4 times per year    Active Member of [employer] or Organizations: No    Attends Banker Meetings: Patient unable to answer    Marital Status: Married  Catering manager Violence: Not At Risk (03/29/2023)   Humiliation, Afraid, Rape, and Kick questionnaire    Fear of Current or Ex-Partner: No    Emotionally Abused: No    Physically Abused: No    Sexually Abused: No   Family History  Problem Relation Age of Onset   Liver cancer Mother    Colon polyps Father    Ehlers-Danlos syndrome Sister    Colon cancer Neg Hx    Stomach cancer Neg Hx    Rectal cancer Neg Hx    Pancreatic cancer Neg Hx     Current Outpatient Medications  Medication Sig Dispense Refill   albuterol  (VENTOLIN  HFA) 108 (90 Base) MCG/ACT inhaler Inhale 2 puffs into the lungs every 6 (six) hours as needed for wheezing  or shortness of breath (Use when out of your home or if unable to reach your nebulizer machine.).     alendronate (FOSAMAX) 70 MG tablet Take 70 mg by mouth every Monday.     azithromycin  (ZITHROMAX ) 250 MG tablet Take 1 tablet (250 mg total) by mouth daily. 90 tablet 3   budesonide -glycopyrrolate -formoterol  (BREZTRI  AEROSPHERE) 160-9-4.8 MCG/ACT AERO inhaler Inhale 2 puffs into the lungs every 12 (twelve) hours. 32.1 g 0   Cholecalciferol (VITAMIN D3 MAXIMUM STRENGTH) 125 MCG (5000 UT) capsule Take 5,000 Units  by mouth daily.     ferrous sulfate 325 (65 FE) MG EC tablet Take 325 mg by mouth daily.     furosemide (LASIX) 80 MG tablet Take 160 mg by mouth 2 (two) times daily.     HYDROcodone -acetaminophen  (NORCO) 7.5-325 MG tablet Take 1 tablet by mouth every 4 (four) hours as needed for severe pain (pain score 7-10).     ibuprofen (ADVIL) 200 MG tablet Take 400 mg by mouth every 6 (six) hours as needed for moderate pain (pain score 4-6).     ipratropium-albuterol  (DUONEB) 0.5-2.5 (3) MG/3ML SOLN Take 3 mLs by nebulization every 4 (four) hours as needed (shortness of breath or wheezing. Use when at home.). 360 mL 0   OHTUVAYRE  3 MG/2.5ML SUSP Inhale 2.5 mLs into the lungs 2 (two) times daily.     omeprazole  (PRILOSEC) 40 MG capsule Take 1 capsule (40 mg total) by mouth daily. 90 capsule 1   traZODone  (DESYREL ) 150 MG tablet Take 0.5-1 tablets (75-150 mg total) by mouth at bedtime for sleep or mood. (Patient taking differently: Take 75 mg by mouth at bedtime.) 90 tablet 3   No current facility-administered medications for this visit.    Allergies  Allergen Reactions   Penicillins Rash    Tolerated cefazolin  08/07/23     REVIEW OF SYSTEMS:  [X]  denotes positive finding, [ ]  denotes negative finding Cardiac  Comments:  Chest pain or chest pressure:    Shortness of breath upon exertion:    Short of breath when lying flat:    Irregular heart rhythm:        Vascular    Pain in calf, thigh,  or hip brought on by ambulation:    Pain in feet at night that wakes you up from your sleep:     Blood clot in your veins:    Leg swelling:         Pulmonary    Oxygen at home:    Productive cough:     Wheezing:         Neurologic    Sudden weakness in arms or legs:     Sudden numbness in arms or legs:     Sudden onset of difficulty speaking or slurred speech:    Temporary loss of vision in one eye:     Problems with dizziness:         Gastrointestinal    Blood in stool:     Vomited blood:         Genitourinary    Burning when urinating:     Blood in urine:        Psychiatric    Major depression:         Hematologic    Bleeding problems:    Problems with blood clotting too easily:        Skin    Rashes or ulcers:        Constitutional    Fever or chills:      PHYSICAL EXAMINATION:  There were no vitals filed for this visit.  General:  WDWN in NAD; vital signs documented above Gait: Not observed HENT: WNL, normocephalic Pulmonary: normal non-labored breathing  Cardiac: regular HR Abdomen: soft, NT, no masses Skin: without rashes Vascular Exam/Pulses:  Right Left  Radial 2+ (normal) 2+ (normal)  Ulnar    Femoral    Popliteal    DP 2+ (normal) 2+ (normal)  PT     Extremities: without ischemic changes, without Gangrene ,  without cellulitis; without open wounds;  Musculoskeletal: no muscle wasting or atrophy  Neurologic: A&O X 3;  No focal weakness or paresthesias are detected Psychiatric:  The pt has Normal affect.   Non-Invasive Vascular Imaging:     Venous Reflux Times  +--------------+---------+------+-----------+------------+-------------+  RIGHT        Reflux NoRefluxReflux TimeDiameter cmsComments                               Yes                                        +--------------+---------+------+-----------+------------+-------------+  CFV          no                                                    +--------------+---------+------+-----------+------------+-------------+  GSV at [facility]    no                            0.67                   +--------------+---------+------+-----------+------------+-------------+  GSV prox thighno                            0.50                   +--------------+---------+------+-----------+------------+-------------+  GSV mid thigh no                            0.51                   +--------------+---------+------+-----------+------------+-------------+  GSV dist thighno                            0.52    out of fascia  +--------------+---------+------+-----------+------------+-------------+  GSV at knee             yes    >500 ms      0.42                   +--------------+---------+------+-----------+------------+-------------+  GSV prox calf no                            0.25                   +--------------+---------+------+-----------+------------+-------------+  GSV mid calf  no                            0.26                   +--------------+---------+------+-----------+------------+-------------+  GSV dist calf           yes                 0.33                   +--------------+---------+------+-----------+------------+-------------+  SSV at [facility]  no                            0.33                   +--------------+---------+------+-----------+------------+-------------+     ASSESSMENT/PLAN:: 68 y.o. male presenting with bilateral lower extremity edema, right greater than left.   Edema extending from the toes to the knee.  Stemmer sign present.  No wounds, 2+ pitting edema.  No fibrosis.   Right lower extremity venous reflux was reviewed demonstrating a small amount of focal greater saphenous vein reflux at the knee.  Otherwise no significant reflux present in the superficial or deep systems.  I had a long conversation with Lynwood regarding the above.  His symptoms are most  consistent with lymphedema.  I am unsure as to why he would have primary lymphedema symptoms arise at the age of 47.  We discussed that he should probably be tested for Lyme disease, as this can cause lymphedema.  He denied history of lymphadenopathy, as well as symptoms consistent with Lyme disease, so I think that this is low on the differential.   We had a long discussion regarding the best course of treatment for lymphedema.  He is aware that this is a lifelong diagnosis with therapies directed at managing symptoms.  He would benefit most from elevation, compression, and exercise.  He would benefit from referral to the lymphedema clinic in [facility].  We discussed that I would ensure that this note will be delivered to Dr. Loring for Lyme disease testing as well as referral to [facility] lymphedema center.   Fonda FORBES Rim, MD [facility] [phone]

## 2023-10-16 ENCOUNTER — Ambulatory Visit (HOSPITAL_COMMUNITY)
Admission: RE | Admit: 2023-10-16 | Discharge: 2023-10-16 | Disposition: A | Discharge status: Home / Self Care | Source: Ambulatory Visit | Attending: Vascular Surgery | Admitting: Vascular Surgery

## 2023-10-16 ENCOUNTER — Encounter: Payer: Self-pay | Admitting: Vascular Surgery

## 2023-10-16 ENCOUNTER — Ambulatory Visit (INDEPENDENT_AMBULATORY_CARE_PROVIDER_SITE_OTHER): Admitting: Vascular Surgery

## 2023-10-16 VITALS — BP 123/71 | HR 69 | Temp 98.3°F | Resp 18 | Ht 67.0 in | Wt 182.7 lb

## 2023-10-16 DIAGNOSIS — M7989 Other specified soft tissue disorders: Secondary | ICD-10-CM | POA: Diagnosis not present

## 2023-10-16 DIAGNOSIS — I890 Lymphedema, not elsewhere classified: Secondary | ICD-10-CM

## 2023-10-17 ENCOUNTER — Other Ambulatory Visit: Payer: Self-pay

## 2023-10-20 ENCOUNTER — Other Ambulatory Visit (HOSPITAL_COMMUNITY): Payer: Self-pay

## 2023-10-20 ENCOUNTER — Other Ambulatory Visit: Payer: Self-pay

## 2023-10-23 ENCOUNTER — Encounter: Payer: Self-pay | Admitting: Pulmonary Disease

## 2023-10-23 ENCOUNTER — Ambulatory Visit: Admitting: Pulmonary Disease

## 2023-10-23 VITALS — BP 132/72 | HR 65 | Temp 98.4°F | Ht 67.0 in | Wt 181.0 lb

## 2023-10-23 DIAGNOSIS — G4733 Obstructive sleep apnea (adult) (pediatric): Secondary | ICD-10-CM | POA: Diagnosis not present

## 2023-10-23 DIAGNOSIS — J439 Emphysema, unspecified: Secondary | ICD-10-CM

## 2023-10-23 DIAGNOSIS — J4489 Other specified chronic obstructive pulmonary disease: Secondary | ICD-10-CM | POA: Diagnosis not present

## 2023-10-23 MED ORDER — IPRATROPIUM-ALBUTEROL 0.5-2.5 (3) MG/3ML IN SOLN: 3.0000 mL | RESPIRATORY_TRACT | 0 refills | Status: AC | PRN

## 2023-10-23 MED ORDER — AZITHROMYCIN 250 MG PO TABS: 250.0000 mg | ORAL_TABLET | ORAL | 3 refills | Status: AC

## 2023-10-23 NOTE — Progress Notes (Signed)
 a              HANDSOME ANGLIN    [id number]    Oct 06, 1955  Primary Care Physician:Elkins, Tanda Mae, MD  Referring Physician: Loring Tanda Mae, MD [address] [place],  [place] [address]  Chief complaint: Follow up for dyspnea, COPD  HPI: Discussed the use of AI scribe software for clinical note transcription with the patient, who gave verbal consent to proceed.  History of Present Illness Austin Glass is a 68 year old male with COPD, maintained on Breztri  inhaler  Hospitalized in February 2025 for COPD with acute exacerbation, community-acquired pneumonia.  He has been experiencing persistent shortness of breath since his hospitalization for pneumonia. Initially treated with two rounds of oral antibiotics and prednisone , his condition worsened, leading to hospitalization at [facility] at the end of February, followed by a transfer to [facility] in March for six days. During his hospital stay, he received IV antibiotics and was discharged with supplemental oxygen. He continues to use oxygen at two liters per minute, as attempts to reduce it to one and a half liters were unsuccessful. He experiences significant shortness of breath with minimal exertion, such as walking from the bedroom to the bathroom.  He is currently on Breztri , albuterol , and occasionally uses a nebulizer, although he has not been using it regularly. He is also on a daily low-dose azithromycin , prescribed by his primary care physician, to prevent frequent pneumonia exacerbations. Ohtuvayre  nebs was started in May 2025  He has a history of smoking but has quit around March 2025 following his recent hospitalizations. He finds it difficult to maintain this cessation but acknowledges the necessity due to his health condition.  He reports significant swelling in his legs, particularly in the ankles, despite being on Lasix 80 mg daily. The swelling is uneven between his legs. He experiences difficulty with  physical activity due to his shortness of breath and the need to carry an oxygen tank, which has limited his mobility and may contribute to his leg swelling. No current wheezing.  Interim history: Austin Glass is a 69 year old male with COPD and sleep apnea who presents for evaluation of his sleep apnea management.  Obstructive sleep apnea - Home sleep study on August 19, 2023, confirmed mild sleep apnea with an AHI of 12.6 and nocturnal hypoxemia. - CPAP therapy was ordered on September 17, 2023, but therapy has not yet been initiated. - Nocturnal hypoxemia may be exacerbated by underlying COPD. - Occasionally experiences dyspnea at night, sometimes waking up with shortness of breath.  Nocturnal and evening hypoxemia - Does not use supplemental oxygen during the day. - Uses supplemental oxygen in the evenings for approximately 1 to 1.5 hours, particularly when experiencing dyspnea while watching TV.  Chronic obstructive pulmonary disease (copd) symptoms and management - Experiences variability in breathing, with episodes of dyspnea, especially in the mornings and afternoons. - Uses a nebulizer twice daily, which provides symptomatic benefit. - Current medications include Breztri  and azithromycin  (three times weekly). - Uses albuterol  as needed for dyspnea and a nebulizer with albuterol  and possibly DuoNeb as rescue therapy. - Considering Zephyr valve placement but expresses hesitancy regarding invasive procedures.  Outpatient Encounter Medications as of 10/23/2023  Medication Sig   albuterol  (VENTOLIN  HFA) 108 (90 Base) MCG/ACT inhaler Inhale 2 puffs into the lungs every 6 (six) hours as needed for wheezing or shortness of breath (Use when out of your home or if unable to reach  your nebulizer machine.).   alendronate (FOSAMAX) 70 MG tablet Take 70 mg by mouth every Monday.   azithromycin  (ZITHROMAX ) 250 MG tablet Take 1 tablet (250 mg total) by mouth daily.    budesonide -glycopyrrolate -formoterol  (BREZTRI  AEROSPHERE) 160-9-4.8 MCG/ACT AERO inhaler Inhale 2 puffs into the lungs every 12 (twelve) hours.   Cholecalciferol (VITAMIN D3 MAXIMUM STRENGTH) 125 MCG (5000 UT) capsule Take 5,000 Units by mouth daily.   ferrous sulfate 325 (65 FE) MG EC tablet Take 325 mg by mouth daily.   furosemide (LASIX) 80 MG tablet Take 160 mg by mouth 2 (two) times daily.   HYDROcodone -acetaminophen  (NORCO) 7.5-325 MG tablet Take 1 tablet by mouth every 4 (four) hours as needed for severe pain (pain score 7-10).   ibuprofen (ADVIL) 200 MG tablet Take 400 mg by mouth every 6 (six) hours as needed for moderate pain (pain score 4-6).   OHTUVAYRE  3 MG/2.5ML SUSP Inhale 2.5 mLs into the lungs 2 (two) times daily.   omeprazole  (PRILOSEC) 40 MG capsule Take 1 capsule (40 mg total) by mouth daily.   traZODone  (DESYREL ) 150 MG tablet Take 0.5-1 tablets (75-150 mg total) by mouth at bedtime for sleep or mood.   [DISCONTINUED] ipratropium-albuterol  (DUONEB) 0.5-2.5 (3) MG/3ML SOLN Take 3 mLs by nebulization every 4 (four) hours as needed (shortness of breath or wheezing. Use when at home.).   ipratropium-albuterol  (DUONEB) 0.5-2.5 (3) MG/3ML SOLN Take 3 mLs by nebulization every 4 (four) hours as needed (shortness of breath or wheezing. Use when at home.).   No facility-administered encounter medications on file as of 10/23/2023.    Vitals:   10/23/23 1028  BP: 132/72  Pulse: 65  Temp: 98.4 F (36.9 C)  Height: 5' 7 (1.702 m)  Weight: 181 lb (82.1 kg)  SpO2: 90%  TempSrc: Oral  BMI (Calculated): 28.34    Physical Exam GEN: No acute distress CV: Regular rate and rhythm no murmurs LUNGS: Clear to auscultation bilaterally normal respiratory effort SKIN JOINTS: Warm and dry no rash   Data Reviewed: Imaging: Chest x-ray 12/28/2020-coarse perihilar bronchovascular markings.   CT angiogram [[facility]] 05/11/2023-previously seen patchy tree-in-bud, reticular density in  the right middle lobe is less conspicuous suggestive of resolving pneumonia, no pulmonary embolism  High-res CT 07/23/2023-no evidence of interstitial lung disease, endobronchial debris's and mucoid impaction in the right lower lobe, emphysema.  I have reviewed the images personally.  PFTs: 11/21/2021 FVC 2.59 [64%], FEV1 0.98 [32%], F/F38, TLC 8.41 (131%], DLCO 13.12 [54%] Severe obstruction with hyperinflation, air trapping Moderate diffusion defect  Labs: Labs from primary care CBC 12/28/2020-WBC 8.1, eos 0% Metabolic panel-within normal limits  Cardiac: Echocardiogram 04/21/2023 LVEF 65-70%, normal RV systolic size and function, trivial MR, trivial TR.  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 Symptoms include shortness of breath, particularly in the evening, and occasional need for oxygen. Current treatment includes Breztri  inhaler, nebulizer with albuterol , and azithromycin . Azithromycin  is being reduced to three times a week to minimize side effects such as potential effects on eyes and hearing. Discussed the potential benefit of pulmonary rehabilitation and the Zephyr valve procedure, but he prefers to hold off on these options. Mucinex  is recommended for congestion, and herbal medicine Leopold  is considered safe if beneficial. - Change azithromycin  to three times a week. - Order pulmonary function test before the end of the year. - Continue Breztri  inhaler and nebulizer as needed. - Recommend Mucinex  for congestion. - Discuss pulmonary rehabilitation at [facility] as a future option.  He was referred to pulmonary rehab at [facility] but was told that he needs  to change his pulmonary provider to attend rehab.  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 Nocturnal hypoxemia potentially due to a combination of sleep apnea and COPD. CPAP therapy is recommended to address sleep apnea and potentially improve daytime fatigue and reduce cardiovascular risks. Discussed the option of using CPAP with or without supplemental oxygen, with a plan to start without oxygen and monitor overnight oximetry. Alternative treatments such as dental devices and Inspire implant were discussed, but he is hesitant about invasive procedures. Untreated sleep apnea can increase the risk of hypertension, stroke, and heart attack. - Initiate CPAP therapy without supplemental oxygen. - Monitor overnight oximetry on CPAP once he is acclimatized to the machine - Higher education careers adviser on sleep apnea.   Lymphedema Swelling in the legs is likely due to lymphedema, not heart failure, as echocardiogram and stress test were normal. Elevation and use of a diuretic have helped reduce swelling. Compression stockings were not effective. Referral to a vascular and vein specialist is planned to evaluate leg drainage and suggest further treatment options. - Seen by vascular surgery and referred to lymphedema clinic - Continue elevation of legs and use of diuretic as needed.   Plan/Recommendations: Continue Breztri , Ohtyvayre Continue chronic azithromycin .  Can reduce dose to 3 times per week to reduce side effects Initiate CPAP AutoSet 5-15  Lonna Coder MD [facility] 10/23/2023, 10:46 AM  CC: Loring Tanda Mae, *    `

## 2023-10-23 NOTE — Patient Instructions (Signed)
  VISIT SUMMARY: You visited us  today to discuss the management of your sleep apnea and COPD. We reviewed your recent home sleep study and discussed your current symptoms and treatment options.  YOUR PLAN: CHRONIC OBSTRUCTIVE PULMONARY DISEASE (COPD) WITH EMPHYSEMA: You have severe COPD with emphysema, which causes shortness of breath, especially in the evening. Your current treatment includes Breztri  inhaler, nebulizer with albuterol , and azithromycin . -Change azithromycin  to three times a week to minimize side effects. -Order a pulmonary function test before the end of the year. -Continue using Breztri  inhaler and nebulizer as needed. -Use Mucinex  for congestion. -Consider pulmonary rehabilitation as a future option.  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 -Start CPAP therapy without supplemental oxygen. -Monitor overnight oximetry while using CPAP. -Review educational materials on sleep apnea.

## 2023-10-27 ENCOUNTER — Other Ambulatory Visit: Payer: Self-pay

## 2023-10-27 ENCOUNTER — Other Ambulatory Visit (HOSPITAL_COMMUNITY): Payer: Self-pay

## 2023-10-30 ENCOUNTER — Ambulatory Visit: Admitting: Pulmonary Disease

## 2023-11-18 ENCOUNTER — Other Ambulatory Visit (HOSPITAL_COMMUNITY): Payer: Self-pay

## 2023-11-18 ENCOUNTER — Other Ambulatory Visit: Payer: Self-pay

## 2023-11-18 MED ORDER — TRAZODONE HCL 150 MG PO TABS
75.0000 mg | ORAL_TABLET | Freq: Every day | ORAL | 3 refills | Status: AC
  Filled 2023-11-18: qty 90, 90d supply, fill #0
  Filled 2024-01-24 – 2024-02-18 (×2): qty 90, 90d supply, fill #1
  Filled 2024-02-18: qty 90, 90d supply, fill #2

## 2023-11-19 ENCOUNTER — Other Ambulatory Visit (HOSPITAL_COMMUNITY): Payer: Self-pay

## 2023-11-27 ENCOUNTER — Encounter: Payer: Self-pay | Admitting: Allergy and Immunology

## 2023-11-27 ENCOUNTER — Ambulatory Visit (INDEPENDENT_AMBULATORY_CARE_PROVIDER_SITE_OTHER): Admitting: Allergy and Immunology

## 2023-11-27 VITALS — BP 136/72 | HR 72 | Resp 14 | Ht 66.5 in | Wt 185.0 lb

## 2023-11-27 DIAGNOSIS — J4489 Other specified chronic obstructive pulmonary disease: Secondary | ICD-10-CM

## 2023-11-27 DIAGNOSIS — J3089 Other allergic rhinitis: Secondary | ICD-10-CM

## 2023-11-27 MED ORDER — IPRATROPIUM BROMIDE 0.06 % NA SOLN: NASAL | 5 refills | Status: AC

## 2023-11-27 NOTE — Patient Instructions (Addendum)
  1. Allergen avoidance measures - dust mite, cat, dog, horse  2. Every day, use OTC Nasacort - 1 spray each nostril 1 time per day  3. Can use ipratropium 0.06%- 2 sprays each nostril every 6 hours to dry nose  4. Can use OTC Fexofenadine 180 - 1 tablet 1 time per day  5. Check CBC w/D. If eosinophils increased can use biologic for COPD  6. Influenza = Tamiflu. Covid = Paxlovid  7. Return to clinic in 4 weeks or earlier if problem

## 2023-11-27 NOTE — Progress Notes (Signed)
 [place] - [place] - [place] - [place] - [place]   Dear Dr. Loring,  Thank you for referring Austin Glass to the [facility]  on 11/27/2023.   Below is a summation of this patient's evaluation and recommendations.  Thank you for your referral. I will keep you informed about this patient's response to treatment.   If you have any questions please do not hesitate to contact me.   Sincerely,  Camellia DOROTHA Denis, MD Allergy / Immunology [facility]    ______________________________________________________________________    NEW PATIENT NOTE  Referring Provider: Loring Tanda Glass, * Primary Provider: Loring Tanda Mae, MD Date of office visit: 11/27/2023    Subjective:   Chief Complaint:  Austin Glass (DOB: 03/14/1955) is a 68 y.o. male who presents to the clinic on 11/27/2023 with a chief complaint of Allergic Rhinitis  .     HPI: Austin Glass presents to this clinic in evaluation of allergies.  He has issues with lots of runny nose with clear rhinorrhea especially in the morning along with some occasional sneezing without any anosmia or ugly nasal discharge that has been a longstanding issue without any obvious trigger.  On occasion he gets some itchy eyes as well.  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 Theophilus, pulmonology, and intermittently uses oxygen in addition to anti-inflammatory agents for his airway.  He still has pretty significant dyspnea on exertion and he has a home oximeter and apparently has oxygen hangs out around 90.  Past Medical History:  Diagnosis Date   Anemia    hx per pt on 08/06/23   Anxiety    Atypical chest pain 04/11/2023   Hx - no current problem per pt on 08/06/23   CAP (community acquired pneumonia) 03/28/2023   x several times - last was on 03/28/23   Continuous dependence on cigarette smoking 03/28/2023   COPD exacerbation (HCC)  03/28/2023   COPD with acute exacerbation (HCC) 03/28/2023   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   Dyspnea    with exertion   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   GERD (gastroesophageal reflux disease) 03/28/2023   History of hemorrhoids 03/28/2023   Hx of adenomatous colonic polyps 01/04/2016   Hypertrophy of nasal turbinates    Insomnia 03/28/2023   Sinus disease    Smoker    SOB (shortness of breath)     Past Surgical History:  Procedure Laterality Date   CARPAL TUNNEL RELEASE Left    COLONOSCOPY     ETHMOIDECTOMY Bilateral 12/05/2015   Procedure: BILATERAL TOTAL  ETHMOIDECTOMY, MAXILLARY OSTEA  ENLARGEMENT;  Surgeon: Lonni FORBES Angle, MD;  Location: [facility];  Service: ENT;  Laterality: Bilateral;   EYE SURGERY Bilateral    cataract   HERNIA REPAIR     UHR   KYPHOPLASTY N/A 08/07/2023   Procedure: KYPHOPLASTY THORACIC SIX;  Surgeon: Mavis Purchase, MD;  Location: [facility] OR;  Service: Neurosurgery;  Laterality: N/A;  Kyphoplasty - T6   SINUS ENDO WITH FUSION Bilateral 12/05/2015   Procedure: FUCTIONAL  ENDOSCOPIC SINUS SURGERY WITH FUSION NAVIGATION;  Surgeon: Lonni FORBES Angle, MD;  Location: [facility];  Service: ENT;  Laterality: Bilateral;   TONSILLECTOMY     TURBINATE REDUCTION Bilateral 12/05/2015   Procedure: BILATERAL TURBINATE REDUCTION;  Surgeon: Lonni FORBES Angle, MD;  Location: [facility];  Service: ENT;  Laterality: Bilateral;   ULNAR  NERVE REPAIR Left    decompression   WISDOM TOOTH EXTRACTION      Allergies as of 11/27/2023       Reactions   Penicillins Rash   Tolerated cefazolin  08/07/23        Medication List    albuterol  108 (90 Base) MCG/ACT inhaler Commonly known as: VENTOLIN  HFA Inhale 2 puffs into the lungs every 6 (six) hours as needed for wheezing or shortness of breath (Use when out of your home or if unable to reach your nebulizer machine.).   alendronate 70  MG tablet Commonly known as: FOSAMAX Take 70 mg by mouth every Monday.   azithromycin  250 MG tablet Commonly known as: ZITHROMAX  Take 1 tablet (250 mg total) by mouth 3 (three) times a week.   budesonide -glycopyrrolate -formoterol  160-9-4.8 MCG/ACT Aero inhaler Commonly known as: Breztri  Aerosphere Inhale 2 puffs into the lungs every 12 (twelve) hours.   ferrous sulfate 325 (65 FE) MG EC tablet Take 325 mg by mouth daily.   furosemide 80 MG tablet Commonly known as: LASIX Take 160 mg by mouth 2 (two) times daily.   HYDROcodone -acetaminophen  7.5-325 MG tablet Commonly known as: NORCO Take 1 tablet by mouth every 4 (four) hours as needed for severe pain (pain score 7-10).   ibuprofen 200 MG tablet Commonly known as: ADVIL Take 400 mg by mouth every 6 (six) hours as needed for moderate pain (pain score 4-6).   ipratropium-albuterol  0.5-2.5 (3) MG/3ML Soln Commonly known as: DUONEB Take 3 mLs by nebulization every 4 (four) hours as needed (shortness of breath or wheezing. Use when at home.).   MULTIVITAMIN ADULT PO Take by mouth daily.   Ohtuvayre  3 MG/2.5ML Susp Generic drug: Ensifentrine  Inhale 2.5 mLs into the lungs 2 (two) times daily.   omeprazole  40 MG capsule Commonly known as: PRILOSEC Take 1 capsule (40 mg total) by mouth daily.   sildenafil 100 MG tablet Commonly known as: VIAGRA Take 100 mg by mouth daily as needed.   traZODone  150 MG tablet Commonly known as: DESYREL  Take 0.5-1 tablets (75-150 mg total) by mouth at bedtime for sleep or mood.   Vitamin D3 Maximum Strength 125 MCG (5000 UT) capsule Generic drug: Cholecalciferol Take 5,000 Units by mouth daily.    Review of systems negative except as noted in HPI / PMHx or noted below:  Review of Systems  Constitutional: Negative.   HENT: Negative.    Eyes: Negative.   Respiratory: Negative.    Cardiovascular: Negative.   Gastrointestinal: Negative.   Genitourinary: Negative.   Musculoskeletal:  Negative.   Skin: Negative.   Neurological: Negative.   Endo/Heme/Allergies: Negative.   Psychiatric/Behavioral: Negative.      Family History  Problem Relation Age of Onset   Liver cancer Mother    Colon polyps Father    Heart failure Father    Ehlers-Danlos syndrome Sister    Colon cancer Neg Hx    Stomach cancer Neg Hx    Rectal cancer Neg Hx    Pancreatic cancer Neg Hx     Social History   Socioeconomic History   Marital status: Divorced    Spouse name: Not on file   Number of children: 1   Years of education: Not on file   Highest education level: Not on file  Occupational History   Occupation: retired  Tobacco Use   Smoking status: Former    Current packs/day: 0.00    Types: Cigarettes    Quit date: 09/1970    Years  since quitting: 53.1   Smokeless tobacco: Never   Tobacco comments:    Quit smoking in April 2025.  Vaping Use   Vaping status: Never Used  Substance and Sexual Activity   Alcohol use: Yes    Comment: once monthly, beer/mixed drink   Drug use: No   Sexual activity: Yes  Other Topics Concern   Not on file  Social History Narrative   Married, 1 grown child   still smokes a few cigarettes a day   Alcohol once a month no drug use   Social Drivers of Corporate Investment Banker Strain: Not on file  Food Insecurity: No Food Insecurity (03/29/2023)   Hunger Vital Sign    Worried About Running Out of Food in the Last Year: Never true    Ran Out of Food in the Last Year: Never true  Transportation Needs: No Transportation Needs (03/29/2023)   PRAPARE - Administrator, Civil Service (Medical): No    Lack of Transportation (Non-Medical): No  Physical Activity: Not on file  Stress: Not on file  Social Connections: Moderately Integrated (03/29/2023)   Social Connection and Isolation Panel    Frequency of Communication with Friends and Family: More than three times a week    Frequency of Social Gatherings with Friends and Family: Once a  week    Attends Religious Services: More than 4 times per year    Active Member of [employer] or Organizations: No    Attends Banker Meetings: Patient unable to answer    Marital Status: Married  Catering Manager Violence: Not At Risk (03/29/2023)   Humiliation, Afraid, Rape, and Kick questionnaire    Fear of Current or Ex-Partner: No    Emotionally Abused: No    Physically Abused: No    Sexually Abused: No   Environmental history significant for living in a house with a dry environment, a dog located inside the household, no carpet in the bedroom, plastic on the bed, plastic on the pillow, and no smoking ongoing with inside the household.  Objective:   Vitals:   11/27/23 1448  BP: 136/72  Pulse: 72  Resp: 14  SpO2: (!) 89%   Height: 5' 6.5 (168.9 cm) Weight: 185 lb (83.9 kg)  Physical Exam Constitutional:      Appearance: He is not diaphoretic.  HENT:     Head: Normocephalic.     Right Ear: Tympanic membrane, ear canal and external ear normal.     Left Ear: Tympanic membrane, ear canal and external ear normal.     Nose: Nose normal. No mucosal edema or rhinorrhea.     Mouth/Throat:     Pharynx: Uvula midline. No oropharyngeal exudate.  Eyes:     Conjunctiva/sclera: Conjunctivae normal.  Neck:     Thyroid : No thyromegaly.     Trachea: Trachea normal. No tracheal tenderness or tracheal deviation.  Cardiovascular:     Rate and Rhythm: Normal rate and regular rhythm.     Heart sounds: Normal heart sounds, S1 normal and S2 normal. No murmur heard. Pulmonary:     Effort: No respiratory distress.     Breath sounds: Normal breath sounds. No stridor. No wheezing or rales.  Lymphadenopathy:     Head:     Right side of head: No tonsillar adenopathy.     Left side of head: No tonsillar adenopathy.     Cervical: No cervical adenopathy.  Skin:    Findings: No erythema or rash.  Nails: There is no clubbing.  Neurological:     Mental Status: He is alert.      Diagnostics: Allergy skin tests were performed.  He demonstrated hypersensitivity to house dust mite, cat, dog, horse.  Review blood test obtained 06 August 2023 identifies WBC 9.6, absolute eosinophil 100, absolute lymphocyte 5600, hemoglobin 14.6, platelet 351.  Results of pulmonary function tests obtained 21 November 2021 identifies TLC 131% of predicted, RV 233% of predicted, DL/VA 40% predicted  Results of a high-resolution chest CT scan obtained 18 July 2023 identifies the following:  Cardiovascular: Atherosclerotic calcification of the aorta. Heart size normal. No pericardial effusion.   Mediastinum/Nodes: No pathologically enlarged mediastinal or axillary lymph nodes. Hilar regions are difficult to definitively evaluate without IV contrast. Esophagus is grossly unremarkable.   Lungs/Pleura: Centrilobular and paraseptal emphysema. Negative for subpleural reticulation, traction bronchiectasis/bronchiolectasis, ground glass, architectural distortion or honeycombing. Endobronchial debris and mucoid impaction in the right lower lobe with mild parenchymal scarring. No pleural fluid. Debris in the airway. No air trapping.   Assessment and Plan:    1. Perennial allergic rhinitis   2. COPD with asthma (HCC)     1. Allergen avoidance measures - dust mite, cat, dog, horse  2. Every day, use OTC Nasacort - 1 spray each nostril 1 time per day  3. Can use ipratropium 0.06%- 2 sprays each nostril every 6 hours to dry nose  4. Can use OTC Fexofenadine 180 - 1 tablet 1 time per day  5. Check CBC w/D. If eosinophils increased can use biologic for COPD  6. Influenza = Tamiflu. Covid = Paxlovid  7. Return to clinic in 4 weeks or earlier if problem  Austin Glass will do his best to perform allergen avoidance measures directed against specific aeroallergens and he will utilize some nasal steroid and if required an antihistamine and nasal ipratropium.  Concerning his COPD/asthma overlap, we  will repeat his eosinophil level and if it is above 150 he would qualify for a biologic.  I will see him back in this clinic in 4 weeks.  Camellia DOROTHA Denis, MD Allergy / Immunology [facility] 

## 2023-12-01 ENCOUNTER — Other Ambulatory Visit: Payer: Self-pay

## 2023-12-01 ENCOUNTER — Other Ambulatory Visit (HOSPITAL_COMMUNITY): Payer: Self-pay

## 2023-12-04 ENCOUNTER — Ambulatory Visit: Payer: Self-pay | Admitting: Allergy and Immunology

## 2023-12-04 ENCOUNTER — Other Ambulatory Visit (HOSPITAL_COMMUNITY): Payer: Self-pay

## 2023-12-04 ENCOUNTER — Ambulatory Visit: Admitting: *Deleted

## 2023-12-04 DIAGNOSIS — J439 Emphysema, unspecified: Secondary | ICD-10-CM | POA: Diagnosis not present

## 2023-12-04 DIAGNOSIS — J4489 Other specified chronic obstructive pulmonary disease: Secondary | ICD-10-CM | POA: Diagnosis not present

## 2023-12-04 LAB — CBC WITH DIFFERENTIAL/PLATELET
Basophils Absolute: 0.1 x10E3/uL (ref 0.0–0.2)
Basos: 1 %
EOS (ABSOLUTE): 0.1 x10E3/uL (ref 0.0–0.4)
Eos: 2 %
Hematocrit: 46.2 % (ref 37.5–51.0)
Hemoglobin: 15.2 g/dL (ref 13.0–17.7)
Immature Grans (Abs): 0 x10E3/uL (ref 0.0–0.1)
Immature Granulocytes: 0 %
Lymphocytes Absolute: 1.2 x10E3/uL (ref 0.7–3.1)
Lymphs: 16 %
MCH: 31.9 pg (ref 26.6–33.0)
MCHC: 32.9 g/dL (ref 31.5–35.7)
MCV: 97 fL (ref 79–97)
Monocytes Absolute: 0.7 x10E3/uL (ref 0.1–0.9)
Monocytes: 10 %
Neutrophils Absolute: 5.2 x10E3/uL (ref 1.4–7.0)
Neutrophils: 71 %
Platelets: 293 x10E3/uL (ref 150–450)
RBC: 4.77 x10E6/uL (ref 4.14–5.80)
RDW: 13.2 % (ref 11.6–15.4)
WBC: 7.3 x10E3/uL (ref 3.4–10.8)

## 2023-12-04 LAB — PULMONARY FUNCTION TEST
DL/VA % pred: 59 %
DL/VA: 2.45 ml/min/mmHg/L
DLCO cor % pred: 60 %
DLCO cor: 14.06 ml/min/mmHg
DLCO unc % pred: 60 %
DLCO unc: 14.06 ml/min/mmHg
FEF 25-75 Post: 0.39 L/s
FEF 25-75 Pre: 0.48 L/s
FEF2575-%Change-Post: -18 %
FEF2575-%Pred-Post: 17 %
FEF2575-%Pred-Pre: 21 %
FEV1-%Change-Post: -2 %
FEV1-%Pred-Post: 38 %
FEV1-%Pred-Pre: 39 %
FEV1-Post: 1.09 L
FEV1-Pre: 1.12 L
FEV1FVC-%Change-Post: 7 %
FEV1FVC-%Pred-Pre: 50 %
FEV6-%Change-Post: -8 %
FEV6-%Pred-Post: 71 %
FEV6-%Pred-Pre: 77 %
FEV6-Post: 2.6 L
FEV6-Pre: 2.83 L
FEV6FVC-%Change-Post: 1 %
FEV6FVC-%Pred-Post: 102 %
FEV6FVC-%Pred-Pre: 101 %
FVC-%Change-Post: -9 %
FVC-%Pred-Post: 69 %
FVC-%Pred-Pre: 76 %
FVC-Post: 2.7 L
FVC-Pre: 2.98 L
Post FEV1/FVC ratio: 40 %
Post FEV6/FVC ratio: 97 %
Pre FEV1/FVC ratio: 38 %
Pre FEV6/FVC Ratio: 95 %
RV % pred: 157 %
RV: 3.52 L
TLC % pred: 122 %
TLC: 7.74 L

## 2023-12-04 NOTE — Patient Instructions (Signed)
 Full PFT performed today.

## 2023-12-04 NOTE — Progress Notes (Signed)
 Full PFT performed today.

## 2023-12-08 ENCOUNTER — Ambulatory Visit: Admitting: Podiatry

## 2023-12-18 ENCOUNTER — Ambulatory Visit: Payer: Self-pay | Admitting: Pulmonary Disease

## 2023-12-30 ENCOUNTER — Other Ambulatory Visit: Payer: Self-pay

## 2023-12-30 MED ORDER — OMEPRAZOLE 40 MG PO CPDR: 40.0000 mg | DELAYED_RELEASE_CAPSULE | Freq: Every day | ORAL | 0 refills | Status: AC

## 2023-12-30 NOTE — Telephone Encounter (Signed)
 Omeprazole  refilled and note attached that will need appointment for future refills.

## 2024-01-08 ENCOUNTER — Ambulatory Visit (INDEPENDENT_AMBULATORY_CARE_PROVIDER_SITE_OTHER): Admitting: Pulmonary Disease

## 2024-01-08 ENCOUNTER — Other Ambulatory Visit (HOSPITAL_COMMUNITY): Payer: Self-pay

## 2024-01-08 ENCOUNTER — Encounter: Payer: Self-pay | Admitting: Pulmonary Disease

## 2024-01-08 ENCOUNTER — Other Ambulatory Visit: Payer: Self-pay

## 2024-01-08 VITALS — BP 113/70 | HR 71 | Temp 98.4°F | Ht 67.0 in | Wt 189.0 lb

## 2024-01-08 DIAGNOSIS — J439 Emphysema, unspecified: Secondary | ICD-10-CM

## 2024-01-08 DIAGNOSIS — J449 Chronic obstructive pulmonary disease, unspecified: Secondary | ICD-10-CM

## 2024-01-08 DIAGNOSIS — I890 Lymphedema, not elsewhere classified: Secondary | ICD-10-CM

## 2024-01-08 DIAGNOSIS — G4733 Obstructive sleep apnea (adult) (pediatric): Secondary | ICD-10-CM | POA: Diagnosis not present

## 2024-01-08 DIAGNOSIS — Z87891 Personal history of nicotine dependence: Secondary | ICD-10-CM | POA: Diagnosis not present

## 2024-01-08 NOTE — Patient Instructions (Signed)
°  VISIT SUMMARY: During your visit, we discussed your severe COPD, obstructive sleep apnea, and leg lymphedema. We reviewed your current treatments and made some adjustments to your care plan to help manage your symptoms more effectively.  YOUR PLAN: SEVERE CHRONIC OBSTRUCTIVE PULMONARY DISEASE (COPD): You have severe COPD with fluctuating symptoms and occasional drops in oxygen levels. -Continue using Breztri  and Albuterol  inhalers as prescribed. -Continue taking azithromycin  daily as per your preference. -Continue using oxygen therapy, especially in the evenings. -You are referred to an in-person rehab facility in [place] for 8-12 weeks. -We will schedule your annual CT scan for June next year.  OBSTRUCTIVE SLEEP APNEA: Your sleep apnea is being managed with CPAP therapy, which has improved your sleep quality but causes some nasal swelling and tenderness. -Continue using CPAP therapy.  LYMPHEDEMA OF LEG: Your leg lymphedema has improved significantly. -Continue current management as your symptoms have improved.

## 2024-01-08 NOTE — Progress Notes (Signed)
 a              Austin Glass    [id number]    [birth year]  Primary Care Physician:Elkins, Tanda Mae, MD  Referring Physician: Loring Tanda Mae, MD [address] [place],  [place] [address]  Chief complaint: Follow up for dyspnea, COPD  HPI: Discussed the use of AI scribe software for clinical note transcription with the patient, who gave verbal consent to proceed.  History of Present Illness Austin Glass is a 68 year old male with COPD, maintained on Breztri  inhaler  Hospitalized in February 2025 for COPD with acute exacerbation, community-acquired pneumonia.  He has a history of smoking but has quit around March 2025 following his recent hospitalizations. Home sleep study on August 19, 2023, confirmed mild sleep apnea with an AHI of 12.6 and nocturnal hypoxemia.  COPD maintained on Breztri , chronic azithromycin , chronic oxygen Ohtuvayre  initiated September 2025  Interim history: Austin Glass is a 68 year old male with severe COPD who presents with fluctuating respiratory symptoms. He is accompanied by [name], his partner.  Dyspnea and hypoxemia - Variable dyspnea with good days and days of severe breathlessness limiting mobility - Home oxygen saturations typically 92-95%, with occasional drops to 87-88% - Uses 2 L supplemental oxygen in the evenings as prescribed post-hospital discharge  Respiratory therapy and medication tolerance - Uses Breztri , Albuterol , daily azithromycin , and nebulized Otovar - Nebulized Ohtuvayre  sometimes causes throat tingling - Uses CPAP, which improves sleep but causes nasal swelling and tenderness  Pulmonary function - Most recent lung function test showed diffusion capacity of 60%, improved from 54% in 2023  Peripheral edema - Persistent but improved leg lymphedema - Inconsistent use of compression garments  Medication access and cost concerns - Worried about medication costs and possible insurance changes affecting access to  respiratory treatments  Outpatient Encounter Medications as of 01/08/2024  Medication Sig   albuterol  (VENTOLIN  HFA) 108 (90 Base) MCG/ACT inhaler Inhale 2 puffs into the lungs every 6 (six) hours as needed for wheezing or shortness of breath (Use when out of your home or if unable to reach your nebulizer machine.).   alendronate (FOSAMAX) 70 MG tablet Take 70 mg by mouth every Monday. (Patient taking differently: Take 70 mg by mouth every Monday. PRN)   azithromycin  (ZITHROMAX ) 250 MG tablet Take 1 tablet (250 mg total) by mouth 3 (three) times a week.   budesonide -glycopyrrolate -formoterol  (BREZTRI  AEROSPHERE) 160-9-4.8 MCG/ACT AERO inhaler Inhale 2 puffs into the lungs every 12 (twelve) hours.   ferrous sulfate 325 (65 FE) MG EC tablet Take 325 mg by mouth daily.   furosemide (LASIX) 80 MG tablet Take 160 mg by mouth 2 (two) times daily.   HYDROcodone -acetaminophen  (NORCO) 7.5-325 MG tablet Take 1 tablet by mouth every 4 (four) hours as needed for severe pain (pain score 7-10).   ibuprofen (ADVIL) 200 MG tablet Take 400 mg by mouth every 6 (six) hours as needed for moderate pain (pain score 4-6).   ipratropium (ATROVENT ) 0.06 % nasal spray Can use two sprays in each nostril every six hours if needed to dry up nose.   ipratropium-albuterol  (DUONEB) 0.5-2.5 (3) MG/3ML SOLN Take 3 mLs by nebulization every 4 (four) hours as needed (shortness of breath or wheezing. Use when at home.).   Multiple Vitamin (MULTIVITAMIN ADULT PO) Take by mouth daily.   OHTUVAYRE  3 MG/2.5ML SUSP Inhale 2.5 mLs into the lungs 2 (two) times daily.   omeprazole  (PRILOSEC) 40 MG capsule Take 1  capsule (40 mg total) by mouth daily. Call for appointment for future refills   sildenafil (VIAGRA) 100 MG tablet Take 100 mg by mouth daily as needed.   traZODone  (DESYREL ) 150 MG tablet Take 0.5-1 tablets (75-150 mg total) by mouth at bedtime for sleep or mood.   Cholecalciferol (VITAMIN D3 MAXIMUM STRENGTH) 125 MCG (5000 UT)  capsule Take 5,000 Units by mouth daily. (Patient not taking: Reported on 01/08/2024)   No facility-administered encounter medications on file as of 01/08/2024.    Vitals:   01/08/24 1420  BP: 113/70  Pulse: 71  Temp: 98.4 F (36.9 C)  Height: 5' 7 (1.702 m)  Weight: 189 lb (85.7 kg)  SpO2: 93%  TempSrc: Oral  BMI (Calculated): 29.59    Physical Exam  GEN: No acute distress CV: Regular rate and rhythm no murmurs LUNGS: Clear to auscultation bilaterally normal respiratory effort SKIN JOINTS: Warm and dry no rash   Data Reviewed: Imaging: Chest x-ray 12/28/2020-coarse perihilar bronchovascular markings.   CT angiogram [[facility]] 05/11/2023-previously seen patchy tree-in-bud, reticular density in the right middle lobe is less conspicuous suggestive of resolving pneumonia, no pulmonary embolism  High-res CT 07/23/2023-no evidence of interstitial lung disease, endobronchial debris's and mucoid impaction in the right lower lobe, emphysema.  I have reviewed the images personally.  PFTs: 11/21/2021 FVC 2.59 [64%], FEV1 0.98 [32%], F/F38, TLC 8.41 (131%], DLCO 13.12 [54%] Severe obstruction with hyperinflation, air trapping Moderate diffusion defect  12/04/2023 FVC 2.70 [69%], FEV1 1.09 [38%], F/F40, TLC 7.74 [122%], DLCO 14.06 [60%] Severe obstruction, with hyperinflation, air trapping Moderate diffusion defect  Labs: CBC 12/03/2023-WBC 7.3, eos 2%, absolute eosinophil count 146 IgE 06/02/2023-252 Alpha-1 antitrypsin 06/02/2023-172, PI MM  Cardiac: Echocardiogram 04/21/2023 LVEF 65-70%, normal RV systolic size and function, trivial MR, trivial TR.  RVSP could not be estimated  Cardiac CT 05/26/2023-coronary calcium score of 34, nonobstructive coronary artery disease  Says that Home sleep study 08/19/2023 Mild sleep apnea with significant levels of O2 desaturation AHI 12.6  Download 01/07/2024-87% compliance, residual AHI 4.1 Assessment & Plan Severe chronic  obstructive pulmonary disease Severe COPD with variable symptoms, including good and bad days. Oxygen saturation levels fluctuate, with occasional drops to 87-88%. Smoking cessation in March 2025 has improved diffusion capacity from 54% to 60%. Current treatment includes Breztri , Albuterol , and azithromycin .  We discussed continue azithromycin  3 times weekly but he prefers to keep it daily.  Ohtuvayre  nebulizer causes occasional coughing. Oxygen therapy is used primarily in the evenings. Discussed the importance of oxygen therapy to prevent strain on the heart and other organs. Rehab options discussed, with preference for in-person rehab at a facility in [place].  - Continue Breztri , Ohtuvayre  and Albuterol  inhalers. - Continue azithromycin  daily per patient preference - Continue oxygen therapy, especially in the evenings. - Referred to in-person rehab facility in [place] for 8-12 weeks. - Referred for annual screening CT chest  Obstructive sleep apnea Managed with CPAP therapy. Reports improved sleep quality and ability to sleep on his back. CPAP causes nasal swelling and tenderness. - Continue CPAP therapy.  Download reviewed with good compliance  Lymphedema of leg Lymphedema of the leg has improved significantly. No longer using compression stockings or leg elevation. - Continue current management as symptoms have improved.  Plan/Recommendations: Continue Breztri , Ohtyvayre, supplemental oxygen Continue chronic azithromycin . CPAP Pulmonary rehab  I personally spent a total of 41 minutes in the care of the patient today including preparing to see the patient, getting/reviewing separately obtained history, counseling and educating,  documenting clinical information in the EHR, independently interpreting results, communicating results, and coordinating care.   Lonna Coder MD [facility] 01/08/2024, 2:29 PM  CC: Loring Tanda Mae, MD

## 2024-01-09 ENCOUNTER — Encounter (HOSPITAL_COMMUNITY): Payer: Self-pay

## 2024-01-09 ENCOUNTER — Telehealth (HOSPITAL_COMMUNITY): Payer: Self-pay

## 2024-01-09 NOTE — Telephone Encounter (Signed)
Attempted to call patient in regards to Pulmonary Rehab - LM on VM   Sent letter 

## 2024-01-12 ENCOUNTER — Telehealth (HOSPITAL_COMMUNITY): Payer: Self-pay

## 2024-01-12 ENCOUNTER — Encounter (HOSPITAL_COMMUNITY): Payer: Self-pay

## 2024-01-12 NOTE — Telephone Encounter (Signed)
 Pt insurance is active and benefits verified through [employer]. Co-pay $15, DED 0/0 met, out of pocket $3,400/$3,400 met, co-insurance 0%. no pre-authorization required, Harvinder/[employer] 12/15@2 :05, REF# [id number]

## 2024-01-13 ENCOUNTER — Telehealth (HOSPITAL_COMMUNITY): Payer: Self-pay

## 2024-01-13 NOTE — Telephone Encounter (Signed)
 Called to confirm appt. Pt confirmed appt. Instructed pt on proper footwear. Gave directions along with department number.

## 2024-01-14 ENCOUNTER — Encounter (HOSPITAL_COMMUNITY)
Admission: RE | Admit: 2024-01-14 | Discharge: 2024-01-14 | Disposition: A | Discharge status: Home / Self Care | Source: Ambulatory Visit | Attending: Pulmonary Disease | Admitting: Pulmonary Disease

## 2024-01-14 ENCOUNTER — Encounter (HOSPITAL_COMMUNITY): Payer: Self-pay

## 2024-01-14 VITALS — BP 112/62 | HR 87 | Ht 67.0 in | Wt 193.6 lb

## 2024-01-14 DIAGNOSIS — J449 Chronic obstructive pulmonary disease, unspecified: Secondary | ICD-10-CM | POA: Diagnosis present

## 2024-01-14 NOTE — Progress Notes (Signed)
 Pulmonary Individual Treatment Plan  Patient Details  Name: Austin Glass MRN: [id number] Date of Birth: 01-20-1956 Referring Provider:   Conrad Ports Pulmonary Rehab Walk Test from 01/14/2024 in [facility]  Referring Provider Mannam    Initial Encounter Date:  Flowsheet Row Pulmonary Rehab Walk Test from 01/14/2024 in [facility]  Date 01/14/24    Visit Diagnosis: Stage 3 severe COPD by GOLD classification (HCC)  Patient's Home Medications on Admission:  Current Medications[1]  Past Medical History: 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   Dyspnea    with exertion   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   GERD (gastroesophageal reflux disease) 03/28/2023   History of hemorrhoids 03/28/2023   Hx of adenomatous colonic polyps 01/04/2016   Hypertrophy of nasal turbinates    Insomnia 03/28/2023   Sinus disease    Smoker    SOB (shortness of breath)     Tobacco Use: Tobacco Use History[2]  Labs: Review Flowsheet        No data to display          Capillary Blood Glucose: No results found for: GLUCAP   Pulmonary Assessment Scores:  Pulmonary Assessment Scores     Row Name 01/14/24 1045 01/14/24 1200       ADL UCSD   ADL Phase Entry Entry    SOB Score total -- 34      CAT Score   CAT Score -- 11      mMRC Score   mMRC Score -- 3      UCSD: Self-administered rating of dyspnea associated with activities of daily living  (ADLs) 6-point scale (0 = not at all to 5 = maximal or unable to do because of breathlessness)  Scoring Scores range from 0 to 120.  Minimally important difference is 5 units  CAT: CAT can identify the health impairment of COPD patients and is better correlated with disease progression.  CAT has a scoring range of zero to 40. The CAT score is classified into four groups of low (less than 10), medium (10 - 20), high (21-30) and very high (31-40) based on the impact level of disease on health status. A CAT score over 10 suggests significant symptoms.  A worsening CAT score could be explained by an exacerbation, poor medication adherence, poor inhaler technique, or progression of COPD or comorbid conditions.  CAT MCID is 2 points  mMRC: mMRC (Modified Medical Research Council) Dyspnea Scale is used to assess the degree of baseline functional disability in patients of respiratory disease due to dyspnea. No minimal important difference is established. A decrease in score of 1 point or greater is considered a positive change.   Pulmonary Function Assessment:  Pulmonary Function Assessment - 01/14/24 1454       Breath   Bilateral Breath Sounds Rhonchi    Shortness of Breath Yes;Limiting activity          Exercise Target Goals:  Exercise Program Goal: Individual exercise prescription set using results from initial 6 min walk test and THRR while considering  patients activity barriers and safety.   Exercise Prescription Goal: Initial exercise prescription builds to 30-45 minutes a day of aerobic activity, 2-3 days per week.  Home exercise guidelines will be given to patient during program as part of exercise prescription that the participant will acknowledge.  Activity Barriers & Risk Stratification:  Activity Barriers & Cardiac Risk Stratification - 01/14/24 1453       Activity Barriers & Cardiac Risk Stratification   Activity Barriers Muscular Weakness;Shortness of  Breath;Deconditioning    Cardiac Risk Stratification Low          6 Minute Walk:  6 Minute Walk     Row Name 01/14/24 1203         6 Minute Walk   Phase Initial     Distance 1381 feet     Walk Time 6 minutes     # of Rest Breaks 0     MPH 2.62     METS 2.82     RPE 11     Perceived Dyspnea  1     VO2 Peak 9.88     Symptoms No     Resting HR 87 bpm     Resting BP 112/62     Resting Oxygen Saturation  90 %     Exercise Oxygen Saturation  during 6 min walk 86 %     Max Ex. HR 102 bpm     Max Ex. BP 114/62     2 Minute Post BP 110/60       Interval HR   1 Minute HR 70     2 Minute HR 92     3 Minute HR 98     4 Minute HR 101     5 Minute HR 102     6 Minute HR 92     2 Minute Post HR 74     Interval Heart Rate? Yes       Interval Oxygen   Interval Oxygen? Yes     Baseline Oxygen Saturation % 90 %     1 Minute Oxygen Saturation % 88 %     1 Minute Liters of Oxygen 0 L     2 Minute Oxygen Saturation % 91 %  1:26 86%     2 Minute Liters of Oxygen 0 L  increased 2L     3 Minute Oxygen Saturation % 90 %     3 Minute Liters of Oxygen 2 L     4 Minute Oxygen Saturation % 98 %     4 Minute Liters of Oxygen 2 L     5 Minute Oxygen Saturation % 89 %  5:32 88%     5 Minute Liters of Oxygen 2 L  increased to 3L     6 Minute Oxygen Saturation % 93 %     6 Minute Liters of Oxygen 3 L     2 Minute Post Oxygen Saturation % 95 %     2 Minute Post Liters of Oxygen 2 L        Oxygen Initial Assessment:  Oxygen Initial Assessment - 01/14/24 1039       Home Oxygen   Home Oxygen Device Portable Concentrator;E-Tanks    Sleep Oxygen Prescription CPAP    Home Exercise Oxygen Prescription Continuous    Liters per minute 2   PRN  Home Resting Oxygen Prescription Continuous    Liters per minute 2   PRN   Compliance with Home Oxygen Use Yes      Initial 6 min Walk   Oxygen Used Continuous    Liters per minute 2      Program Oxygen Prescription   Program Oxygen  Prescription Continuous    Liters per minute 2      Intervention   Short Term Goals To learn and exhibit compliance with exercise, home and travel O2 prescription;To learn and understand importance of maintaining oxygen saturations>88%;To learn and demonstrate proper use of respiratory medications;To learn and understand importance of monitoring SPO2 with pulse oximeter and demonstrate accurate use of the pulse oximeter.;To learn and demonstrate proper pursed lip breathing techniques or other breathing techniques.     Long  Term Goals Exhibits compliance with exercise, home  and travel O2 prescription;Maintenance of O2 saturations>88%;Compliance with respiratory medication;Verbalizes importance of monitoring SPO2 with pulse oximeter and return demonstration;Exhibits proper breathing techniques, such as pursed lip breathing or other method taught during program session          Oxygen Re-Evaluation:   Oxygen Discharge (Final Oxygen Re-Evaluation):   Initial Exercise Prescription:  Initial Exercise Prescription - 01/14/24 1200       Date of Initial Exercise RX and Referring Provider   Date 01/14/24    Referring Provider Mannam    Expected Discharge Date 04/16/23      Oxygen   Oxygen Continuous    Liters 3    Maintain Oxygen Saturation 88% or higher      NuStep   Level 1    SPM 60    Minutes 15    METs 1.5      Track   Minutes 15    METs 2      Prescription Details   Frequency (times per week) 2    Duration Progress to 30 minutes of continuous aerobic without signs/symptoms of physical distress      Intensity   THRR 40-80% of Max Heartrate 60-121    Ratings of Perceived Exertion 11-13    Perceived Dyspnea 0-4      Progression   Progression Continue to progress workloads to maintain intensity without signs/symptoms of physical distress.      Resistance Training   Training Prescription Yes    Weight blue bands    Reps 10-15          Perform Capillary Blood  Glucose checks as needed.  Exercise Prescription Changes:   Exercise Comments:   Exercise Goals and Review:   Exercise Goals     Row Name 01/14/24 1055             Exercise Goals   Increase Physical Activity Yes       Intervention Provide advice, education, support and counseling about physical activity/exercise needs.;Develop an individualized exercise prescription for aerobic and resistive training based on initial evaluation findings, risk stratification, comorbidities and participant's personal goals.       Expected Outcomes Short Term: Attend rehab on a regular basis to increase amount of physical activity.;Long Term: Add in home exercise to make exercise part of routine and to increase amount of physical activity.;Long Term: Exercising regularly at least 3-5 days a week.       Increase Strength and Stamina Yes       Intervention Provide advice, education, support and counseling about physical activity/exercise needs.;Develop an individualized exercise prescription for aerobic and resistive training based on  initial evaluation findings, risk stratification, comorbidities and participant's personal goals.       Expected Outcomes Short Term: Increase workloads from initial exercise prescription for resistance, speed, and METs.;Short Term: Perform resistance training exercises routinely during rehab and add in resistance training at home;Long Term: Improve cardiorespiratory fitness, muscular endurance and strength as measured by increased METs and functional capacity ( )       Able to understand and use rate of perceived exertion (RPE) scale Yes       Intervention Provide education and explanation on how to use RPE scale       Expected Outcomes Short Term: Able to use RPE daily in rehab to express subjective intensity level;Long Term:  Able to use RPE to guide intensity level when exercising independently       Able to understand and use Dyspnea scale Yes       Intervention Provide  education and explanation on how to use Dyspnea scale       Expected Outcomes Short Term: Able to use Dyspnea scale daily in rehab to express subjective sense of shortness of breath during exertion;Long Term: Able to use Dyspnea scale to guide intensity level when exercising independently       Knowledge and understanding of Target Heart Rate Range (THRR) Yes       Intervention Provide education and explanation of THRR including how the numbers were predicted and where they are located for reference       Expected Outcomes Short Term: Able to state/look up THRR;Long Term: Able to use THRR to govern intensity when exercising independently;Short Term: Able to use daily as guideline for intensity in rehab       Understanding of Exercise Prescription Yes       Intervention Provide education, explanation, and written materials on patient's individual exercise prescription       Expected Outcomes Short Term: Able to explain program exercise prescription;Long Term: Able to explain home exercise prescription to exercise independently          Exercise Goals Re-Evaluation :   Discharge Exercise Prescription (Final Exercise Prescription Changes):   Nutrition:  Target Goals: Understanding of nutrition guidelines, daily intake of sodium 1500mg , cholesterol 200mg , calories 30% from fat and 7% or less from saturated fats, daily to have 5 or more servings of fruits and vegetables.  Biometrics:    Nutrition Therapy Plan and Nutrition Goals:   Nutrition Assessments:  MEDIFICTS Score Key: >=70 Need to make dietary changes  40-70 Heart Healthy Diet <= 40 Therapeutic Level Cholesterol Diet   Picture Your Plate Scores: <59 Unhealthy dietary pattern with much room for improvement. 41-50 Dietary pattern unlikely to meet recommendations for good health and room for improvement. 51-60 More healthful dietary pattern, with some room for improvement.  >60 Healthy dietary pattern, although there may be  some specific behaviors that could be improved.    Nutrition Goals Re-Evaluation:   Nutrition Goals Discharge (Final Nutrition Goals Re-Evaluation):   Psychosocial: Target Goals: Acknowledge presence or absence of significant depression and/or stress, maximize coping skills, provide positive support system. Participant is able to verbalize types and ability to use techniques and skills needed for reducing stress and depression.  Initial Review & Psychosocial Screening:  Initial Psych Review & Screening - 01/14/24 1035       Initial Review   Current issues with None Identified      Family Dynamics   Good Support System? Yes    Comments Spouse and son  Barriers   Psychosocial barriers to participate in program There are no identifiable barriers or psychosocial needs.      Screening Interventions   Interventions Encouraged to exercise          Quality of Life Scores:  Scores of 19 and below usually indicate a poorer quality of life in these areas.  A difference of  2-3 points is a clinically meaningful difference.  A difference of 2-3 points in the total score of the Quality of Life Index has been associated with significant improvement in overall quality of life, self-image, physical symptoms, and general health in studies assessing change in quality of life.  PHQ-9: Review Flowsheet       01/14/2024  Depression screen PHQ 2/9  Decreased Interest 1  Down, Depressed, Hopeless 0  PHQ - 2 Score 1  Altered sleeping 0  Tired, decreased energy 1  Change in appetite 0  Feeling bad or failure about yourself  1  Trouble concentrating 0  Moving slowly or fidgety/restless 0  Suicidal thoughts 0  PHQ-9 Score 3  Difficult doing work/chores Not difficult at all   Interpretation of Total Score  Total Score Depression Severity:  1-4 = Minimal depression, 5-9 = Mild depression, 10-14 = Moderate depression, 15-19 = Moderately severe depression, 20-27 = Severe depression    Psychosocial Evaluation and Intervention:  Psychosocial Evaluation - 01/14/24 1036       Psychosocial Evaluation & Interventions   Interventions Encouraged to exercise with the program and follow exercise prescription    Comments Austin Glass denies any psychosocial barriers at this time.    Expected Outcomes For Austin Glass to participate in PR free of concerns.    Continue Psychosocial Services  No Follow up required          Psychosocial Re-Evaluation:   Psychosocial Discharge (Final Psychosocial Re-Evaluation):   Education: Education Goals: Education classes will be provided on a weekly basis, covering required topics. Participant will state understanding/return demonstration of topics presented.  Learning Barriers/Preferences:  Learning Barriers/Preferences - 01/14/24 1037       Learning Barriers/Preferences   Learning Barriers Sight    Learning Preferences None          Education Topics: Know Your Numbers Group instruction that is supported by a PowerPoint presentation. Instructor discusses importance of knowing and understanding resting, exercise, and post-exercise oxygen saturation, heart rate, and blood pressure. Oxygen saturation, heart rate, blood pressure, rating of perceived exertion, and dyspnea are reviewed along with a normal range for these values.    Exercise for the Pulmonary Patient Group instruction that is supported by a PowerPoint presentation. Instructor discusses benefits of exercise, core components of exercise, frequency, duration, and intensity of an exercise routine, importance of utilizing pulse oximetry during exercise, safety while exercising, and options of places to exercise outside of rehab.    MET Level  Group instruction provided by PowerPoint, verbal discussion, and written material to support subject matter. Instructor reviews what METs are and how to increase METs.    Pulmonary Medications Verbally interactive group education provided by  instructor with focus on inhaled medications and proper administration.   Anatomy and Physiology of the Respiratory System Group instruction provided by PowerPoint, verbal discussion, and written material to support subject matter. Instructor reviews respiratory cycle and anatomical components of the respiratory system and their functions. Instructor also reviews differences in obstructive and restrictive respiratory diseases with examples of each.    Oxygen Safety Group instruction provided by PowerPoint, verbal discussion, and  written material to support subject matter. There is an overview of What is Oxygen and Why do we need it.  Instructor also reviews how to create a safe environment for oxygen use, the importance of using oxygen as prescribed, and the risks of noncompliance. There is a brief discussion on traveling with oxygen and resources the patient may utilize.   Oxygen Use Group instruction provided by PowerPoint, verbal discussion, and written material to discuss how supplemental oxygen is prescribed and different types of oxygen supply systems. Resources for more information are provided.    Breathing Techniques Group instruction that is supported by demonstration and informational handouts. Instructor discusses the benefits of pursed lip and diaphragmatic breathing and detailed demonstration on how to perform both.     Risk Factor Reduction Group instruction that is supported by a PowerPoint presentation. Instructor discusses the definition of a risk factor, different risk factors for pulmonary disease, and how the heart and lungs work together.   Pulmonary Diseases Group instruction provided by PowerPoint, verbal discussion, and written material to support subject matter. Instructor gives an overview of the different type of pulmonary diseases. There is also a discussion on risk factors and symptoms as well as ways to manage the diseases.   Stress and Energy  Conservation Group instruction provided by PowerPoint, verbal discussion, and written material to support subject matter. Instructor gives an overview of stress and the impact it can have on the body. Instructor also reviews ways to reduce stress. There is also a discussion on energy conservation and ways to conserve energy throughout the day.   Warning Signs and Symptoms Group instruction provided by PowerPoint, verbal discussion, and written material to support subject matter. Instructor reviews warning signs and symptoms of stroke, heart attack, cold and flu. Instructor also reviews ways to prevent the spread of infection.   Other Education Group or individual verbal, written, or video instructions that support the educational goals of the pulmonary rehab program.    Knowledge Questionnaire Score:  Knowledge Questionnaire Score - 01/14/24 1200       Knowledge Questionnaire Score   Pre Score 16/18          Core Components/Risk Factors/Patient Goals at Admission:  Personal Goals and Risk Factors at Admission - 01/14/24 1037       Core Components/Risk Factors/Patient Goals on Admission   Improve shortness of breath with ADL's Yes    Intervention Provide education, individualized exercise plan and daily activity instruction to help decrease symptoms of SOB with activities of daily living.    Expected Outcomes Short Term: Improve cardiorespiratory fitness to achieve a reduction of symptoms when performing ADLs;Long Term: Be able to perform more ADLs without symptoms or delay the onset of symptoms    Increase knowledge of respiratory medications and ability to use respiratory devices properly  Yes    Intervention Provide education and demonstration as needed of appropriate use of medications, inhalers, and oxygen therapy.    Expected Outcomes Short Term: Achieves understanding of medications use. Understands that oxygen is a medication prescribed by physician. Demonstrates appropriate  use of inhaler and oxygen therapy.;Long Term: Maintain appropriate use of medications, inhalers, and oxygen therapy.          Core Components/Risk Factors/Patient Goals Review:    Core Components/Risk Factors/Patient Goals at Discharge (Final Review):    ITP Comments:   Comments: Dr. Slater Staff is Medical Director for Pulmonary Rehab at [facility].       [1]  Current Outpatient  Medications:    albuterol  (VENTOLIN  HFA) 108 (90 Base) MCG/ACT inhaler, Inhale 2 puffs into the lungs every 6 (six) hours as needed for wheezing or shortness of breath (Use when out of your home or if unable to reach your nebulizer machine.)., Disp: , Rfl:    alendronate (FOSAMAX) 70 MG tablet, Take 70 mg by mouth every Monday. (Patient taking differently: Take 70 mg by mouth every Monday. PRN), Disp: , Rfl:    azithromycin  (ZITHROMAX ) 250 MG tablet, Take 1 tablet (250 mg total) by mouth 3 (three) times a week., Disp: 36 tablet, Rfl: 3   budesonide -glycopyrrolate -formoterol  (BREZTRI  AEROSPHERE) 160-9-4.8 MCG/ACT AERO inhaler, Inhale 2 puffs into the lungs every 12 (twelve) hours., Disp: 32.1 g, Rfl: 0   Cholecalciferol (VITAMIN D3 MAXIMUM STRENGTH) 125 MCG (5000 UT) capsule, Take 5,000 Units by mouth daily. (Patient not taking: Reported on 01/08/2024), Disp: , Rfl:    ferrous sulfate 325 (65 FE) MG EC tablet, Take 325 mg by mouth daily., Disp: , Rfl:    furosemide (LASIX) 80 MG tablet, Take 160 mg by mouth 2 (two) times daily., Disp: , Rfl:    HYDROcodone -acetaminophen  (NORCO) 7.5-325 MG tablet, Take 1 tablet by mouth every 4 (four) hours as needed for severe pain (pain score 7-10)., Disp: , Rfl:    ibuprofen (ADVIL) 200 MG tablet, Take 400 mg by mouth every 6 (six) hours as needed for moderate pain (pain score 4-6)., Disp: , Rfl:    ipratropium (ATROVENT ) 0.06 % nasal spray, Can use two sprays in each nostril every six hours if needed to dry up nose., Disp: 15 mL, Rfl: 5   ipratropium-albuterol   (DUONEB) 0.5-2.5 (3) MG/3ML SOLN, Take 3 mLs by nebulization every 4 (four) hours as needed (shortness of breath or wheezing. Use when at home.)., Disp: 360 mL, Rfl: 0   Multiple Vitamin (MULTIVITAMIN ADULT PO), Take by mouth daily., Disp: , Rfl:    OHTUVAYRE  3 MG/2.5ML SUSP, Inhale 2.5 mLs into the lungs 2 (two) times daily., Disp: , Rfl:    omeprazole  (PRILOSEC) 40 MG capsule, Take 1 capsule (40 mg total) by mouth daily. Call for appointment for future refills, Disp: 90 capsule, Rfl: 0   sildenafil (VIAGRA) 100 MG tablet, Take 100 mg by mouth daily as needed., Disp: , Rfl:    traZODone  (DESYREL ) 150 MG tablet, Take 0.5-1 tablets (75-150 mg total) by mouth at bedtime for sleep or mood., Disp: 90 tablet, Rfl: 3 [2]  Social History Tobacco Use  Smoking Status Former   Current packs/day: 0.00   Average packs/day: 0.3 packs/day for 52.6 years (13.1 ttl pk-yrs)   Types: Cigarettes   Start date: 09/1970   Quit date: 04/2023   Years since quitting: 0.7  Smokeless Tobacco Never  Tobacco Comments   Quit smoking in April 2025.

## 2024-01-14 NOTE — Progress Notes (Addendum)
 Lynwood ONEIDA Slack 68 y.o. male  Initial Psychosocial Assessment  Pt psychosocial assessment reveals pt lives with their spouse. Pt is currently retired. 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 Pt reports his  stress level is moderate. Areas of stress/anxiety include health and finances.  Pt does not exhibit signs of depression. PHQ-9 is 1/3. Pt shows good  coping skills with positive outlook. Offered emotional support and reassurance. Will continue to monitor.    01/14/2024 10:57 AM

## 2024-01-14 NOTE — Progress Notes (Signed)
 Austin Glass 68 y.o. male Pulmonary Rehab Orientation Note This patient who was referred [facility] by Dr. Theophilus with the diagnosis of COPD 3 arrived today in [facility]. He arrived ambulatory with normal gait. He does carry portable oxygen. [employer] is the provider for their DME. Per patient, Austin Glass uses oxygen intermittently. Color good, skin warm and dry. Patient is oriented Glass time and place. Patient's medical history, psychosocial health, and medications reviewed. Psychosocial assessment reveals patient lives with spouse. Austin Glass is currently retired. 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 Patient reports his stress level is moderate. Areas of stress/anxiety include health and finances. Patient does not exhibit signs of depression. PHQ2/9 score 1/3. Austin Glass gain or lose weight.. Patient's weight will be monitored closely. Demonstration and practice of PLB using pulse oximeter. Austin Glass able Glass return demonstration satisfactorily. Safety and hand hygiene in the exercise area reviewed with patient. Austin Glass voices understanding of the information reviewed. Department expectations discussed with patient and achievable goals were set. The patient shows enthusiasm about attending the program and we look forward Glass working with Austin. Austin Glass completed a 6 min walk test today and is scheduled Glass begin exercise on 01/20/24 at 10:15 am.  [id number] Austin Glass Austin Glass Austin Tylynn Braniff, MS, ACSM-CEP

## 2024-01-20 ENCOUNTER — Encounter (HOSPITAL_COMMUNITY)
Admission: RE | Admit: 2024-01-20 | Discharge: 2024-01-20 | Disposition: A | Discharge status: Home / Self Care | Source: Ambulatory Visit | Attending: Pulmonary Disease | Admitting: Pulmonary Disease

## 2024-01-20 VITALS — Wt 189.4 lb

## 2024-01-20 DIAGNOSIS — J449 Chronic obstructive pulmonary disease, unspecified: Secondary | ICD-10-CM | POA: Diagnosis not present

## 2024-01-20 NOTE — Progress Notes (Signed)
 Daily Session Note  Patient Details  Name: Austin Glass MRN: [id number] Date of Birth: 1955/01/29 Referring Provider:   Conrad Ports Pulmonary Rehab Walk Test from 01/14/2024 in [facility]  Referring Provider Mannam    Encounter Date: 01/20/2024  Check In:  Session Check In - 01/20/24 1156       Check-In   Supervising physician immediately available to respond to emergencies [facility] MD immediately available    Physician(s) Barnie Hila, NP    Location [facility]    Staff Present Ronal Levin, RN, BSN;Casey Claudene, RT;Randi Reeve BS, ACSM-CEP, Exercise Physiologist    Virtual Visit No    Medication changes reported     No    Fall or balance concerns reported    No    Tobacco Cessation No Change    Warm-up and Cool-down Not performed (comment)    Resistance Training Performed No    VAD Patient? No    PAD/SET Patient? No      Pain Assessment   Currently in Pain? No/denies    Multiple Pain Sites No          Capillary Blood Glucose: No results found for this or any previous visit (from the past 24 hours).   Exercise Prescription Changes - 01/20/24 1100       Response to Exercise   Blood Pressure (Admit) 116/66    Blood Pressure (Exercise) 130/70    Blood Pressure (Exit) 122/64    Heart Rate (Admit) 78 bpm    Heart Rate (Exercise) 100 bpm    Heart Rate (Exit) 94 bpm    Oxygen Saturation (Admit) 92 %   RA   Oxygen Saturation (Exercise) 90 %   2L   Oxygen Saturation (Exit) 94 %   2L   Rating of Perceived Exertion (Exercise) 12    Perceived Dyspnea (Exercise) 1    Duration Continue with 30 min of aerobic exercise without signs/symptoms of physical distress.    Intensity THRR unchanged      Progression   Progression Continue to progress workloads to maintain intensity without signs/symptoms of physical distress.      Resistance Training   Weight blue bands    Reps 10-15    Time 10  Minutes      Oxygen   Oxygen Continuous    Liters 2      NuStep   Level 2    Minutes 15    METs 1.7      Track   Laps 16    Minutes 15    METs 2.72      Oxygen   Maintain Oxygen Saturation 88% or higher          Tobacco Use History[1]  Goals Met:  Exercise tolerated well Queuing for purse lip breathing No report of concerns or symptoms today Strength training completed today  Goals Unmet:  Not Applicable  Comments: Service time is from 1007 to 1115    Dr. Slater Staff is Medical Director for Pulmonary Rehab at [facility].     [1]  Social History Tobacco Use  Smoking Status Former   Current packs/day: 0.00   Average packs/day: 0.3 packs/day for 52.6 years (13.1 ttl pk-yrs)   Types: Cigarettes   Start date: 09/1970   Quit date: 04/2023   Years since quitting: 0.7  Smokeless Tobacco Never  Tobacco Comments   Quit smoking in April 2025.

## 2024-01-21 NOTE — Progress Notes (Signed)
 Pulmonary Individual Treatment Plan  Patient Details  Name: Austin Glass MRN: [id number] Date of Birth: Dec 05, 1955 Referring Provider:   Conrad Ports Pulmonary Rehab Walk Test from 01/14/2024 in [facility] for Heart, Vascular, & Lung Health  Referring Provider Mannam    Initial Encounter Date:  Flowsheet Row Pulmonary Rehab Walk Test from 01/14/2024 in [facility] for Heart, Vascular, & Lung Health  Date 01/14/24    Visit Diagnosis: Stage 3 severe COPD by GOLD classification (HCC)  Patient's Home Medications on Admission:  Current Medications[1]  Past Medical History: 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   Dyspnea    with exertion   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   GERD (gastroesophageal reflux disease) 03/28/2023   History of hemorrhoids 03/28/2023   Hx of adenomatous colonic polyps 01/04/2016   Hypertrophy of nasal turbinates    Insomnia 03/28/2023   Sinus disease    Smoker    SOB (shortness of breath)     Tobacco Use: Tobacco Use History[2]  Labs: Review Flowsheet        No data to display          Capillary Blood Glucose: No results found for: GLUCAP   Pulmonary Assessment Scores:  Pulmonary Assessment Scores     Row Name 01/14/24 1045 01/14/24 1200       ADL UCSD   ADL Phase Entry Entry    SOB Score total -- 34      CAT Score   CAT Score -- 11      mMRC Score   mMRC Score -- 3      UCSD: Self-administered rating of dyspnea associated with activities of daily living  (ADLs) 6-point scale (0 = not at all to 5 = maximal or unable to do because of breathlessness)  Scoring Scores range from 0 to 120.  Minimally important difference is 5 units  CAT: CAT can identify the health impairment of COPD patients and is better correlated with disease progression.  CAT has a scoring range of zero to 40. The CAT score is classified into four groups of low (less than 10), medium (10 - 20), high (21-30) and very high (31-40) based on the impact level of disease on health status. A CAT score over 10 suggests significant symptoms.  A worsening CAT score could be explained by an exacerbation, poor medication adherence, poor inhaler technique, or progression of COPD or comorbid conditions.  CAT MCID is 2 points  mMRC: mMRC (Modified Medical Research Council) Dyspnea Scale is used to assess the degree of baseline functional disability in patients of respiratory disease due to dyspnea. No minimal important difference is established. A decrease in score of 1 point or greater is considered a positive change.   Pulmonary Function Assessment:  Pulmonary Function Assessment - 01/14/24 1454       Breath   Bilateral Breath Sounds Rhonchi    Shortness of Breath Yes;Limiting activity          Exercise Target Goals:  Exercise Program Goal: Individual exercise prescription set using results from initial 6 min walk test and THRR while considering  patients activity barriers and safety.   Exercise Prescription Goal: Initial exercise prescription builds to 30-45 minutes a day of aerobic activity, 2-3 days per week.  Home exercise guidelines will be given to patient during program as part of exercise prescription that the participant will acknowledge.  Activity Barriers & Risk Stratification:  Activity Barriers & Cardiac Risk Stratification - 01/14/24 1453       Activity Barriers & Cardiac Risk Stratification   Activity Barriers Muscular Weakness;Shortness of  Breath;Deconditioning    Cardiac Risk Stratification Low          6 Minute Walk:  6 Minute Walk     Row Name 01/14/24 1203         6 Minute Walk   Phase Initial     Distance 1381 feet     Walk Time 6 minutes     # of Rest Breaks 0     MPH 2.62     METS 2.82     RPE 11     Perceived Dyspnea  1     VO2 Peak 9.88     Symptoms No     Resting HR 87 bpm     Resting BP 112/62     Resting Oxygen Saturation  90 %     Exercise Oxygen Saturation  during 6 min walk 86 %     Max Ex. HR 102 bpm     Max Ex. BP 114/62     2 Minute Post BP 110/60       Interval HR   1 Minute HR 70     2 Minute HR 92     3 Minute HR 98     4 Minute HR 101     5 Minute HR 102     6 Minute HR 92     2 Minute Post HR 74     Interval Heart Rate? Yes       Interval Oxygen   Interval Oxygen? Yes     Baseline Oxygen Saturation % 90 %     1 Minute Oxygen Saturation % 88 %     1 Minute Liters of Oxygen 0 L     2 Minute Oxygen Saturation % 91 %  1:26 86%     2 Minute Liters of Oxygen 0 L  increased 2L     3 Minute Oxygen Saturation % 90 %     3 Minute Liters of Oxygen 2 L     4 Minute Oxygen Saturation % 98 %     4 Minute Liters of Oxygen 2 L     5 Minute Oxygen Saturation % 89 %  5:32 88%     5 Minute Liters of Oxygen 2 L  increased to 3L     6 Minute Oxygen Saturation % 93 %     6 Minute Liters of Oxygen 3 L     2 Minute Post Oxygen Saturation % 95 %     2 Minute Post Liters of Oxygen 2 L        Oxygen Initial Assessment:  Oxygen Initial Assessment - 01/14/24 1039       Home Oxygen   Home Oxygen Device Portable Concentrator;E-Tanks    Sleep Oxygen Prescription CPAP    Home Exercise Oxygen Prescription Continuous    Liters per minute 2   PRN  Home Resting Oxygen Prescription Continuous    Liters per minute 2   PRN   Compliance with Home Oxygen Use Yes      Initial 6 min Walk   Oxygen Used Continuous    Liters per minute 2      Program Oxygen Prescription   Program Oxygen  Prescription Continuous    Liters per minute 2      Intervention   Short Term Goals To learn and exhibit compliance with exercise, home and travel O2 prescription;To learn and understand importance of maintaining oxygen saturations>88%;To learn and demonstrate proper use of respiratory medications;To learn and understand importance of monitoring SPO2 with pulse oximeter and demonstrate accurate use of the pulse oximeter.;To learn and demonstrate proper pursed lip breathing techniques or other breathing techniques.     Long  Term Goals Exhibits compliance with exercise, home  and travel O2 prescription;Maintenance of O2 saturations>88%;Compliance with respiratory medication;Verbalizes importance of monitoring SPO2 with pulse oximeter and return demonstration;Exhibits proper breathing techniques, such as pursed lip breathing or other method taught during program session          Oxygen Re-Evaluation:  Oxygen Re-Evaluation     Row Name 01/16/24 1056             Program Oxygen Prescription   Program Oxygen Prescription Continuous       Liters per minute 2         Home Oxygen   Home Oxygen Device Portable Concentrator;E-Tanks       Sleep Oxygen Prescription CPAP       Home Exercise Oxygen Prescription Continuous       Liters per minute 2  PRN       Home Resting Oxygen Prescription Continuous       Liters per minute 2  PRN       Compliance with Home Oxygen Use Yes         Goals/Expected Outcomes   Short Term Goals To learn and exhibit compliance with exercise, home and travel O2 prescription;To learn and understand importance of maintaining oxygen saturations>88%;To learn and demonstrate proper use of respiratory medications;To learn and understand importance of monitoring SPO2 with pulse oximeter and demonstrate accurate use of the pulse oximeter.;To learn and demonstrate proper pursed lip breathing techniques or other breathing techniques.        Long  Term Goals Exhibits compliance  with exercise, home  and travel O2 prescription;Maintenance of O2 saturations>88%;Compliance with respiratory medication;Verbalizes importance of monitoring SPO2 with pulse oximeter and return demonstration;Exhibits proper breathing techniques, such as pursed lip breathing or other method taught during program session       Goals/Expected Outcomes Compliance and understanding of oxygen saturation monitoring and breathing techniques to decrease shortness of breath.          Oxygen Discharge (Final Oxygen Re-Evaluation):  Oxygen Re-Evaluation - 01/16/24 1056       Program Oxygen Prescription   Program Oxygen Prescription Continuous    Liters per minute 2      Home Oxygen   Home Oxygen Device Portable Concentrator;E-Tanks    Sleep Oxygen Prescription CPAP    Home Exercise Oxygen Prescription Continuous    Liters per minute 2   PRN   Home Resting Oxygen Prescription Continuous    Liters per minute 2   PRN   Compliance with Home Oxygen Use Yes      Goals/Expected Outcomes   Short Term Goals To learn and exhibit compliance with exercise, home and travel O2  prescription;To learn and understand importance of maintaining oxygen saturations>88%;To learn and demonstrate proper use of respiratory medications;To learn and understand importance of monitoring SPO2 with pulse oximeter and demonstrate accurate use of the pulse oximeter.;To learn and demonstrate proper pursed lip breathing techniques or other breathing techniques.     Long  Term Goals Exhibits compliance with exercise, home  and travel O2 prescription;Maintenance of O2 saturations>88%;Compliance with respiratory medication;Verbalizes importance of monitoring SPO2 with pulse oximeter and return demonstration;Exhibits proper breathing techniques, such as pursed lip breathing or other method taught during program session    Goals/Expected Outcomes Compliance and understanding of oxygen saturation monitoring and breathing techniques to decrease  shortness of breath.          Initial Exercise Prescription:  Initial Exercise Prescription - 01/14/24 1200       Date of Initial Exercise RX and Referring Provider   Date 01/14/24    Referring Provider Mannam    Expected Discharge Date 04/16/23      Oxygen   Oxygen Continuous    Liters 3    Maintain Oxygen Saturation 88% or higher      NuStep   Level 1    SPM 60    Minutes 15    METs 1.5      Track   Minutes 15    METs 2      Prescription Details   Frequency (times per week) 2    Duration Progress to 30 minutes of continuous aerobic without signs/symptoms of physical distress      Intensity   THRR 40-80% of Max Heartrate 60-121    Ratings of Perceived Exertion 11-13    Perceived Dyspnea 0-4      Progression   Progression Continue to progress workloads to maintain intensity without signs/symptoms of physical distress.      Resistance Training   Training Prescription Yes    Weight blue bands    Reps 10-15          Perform Capillary Blood Glucose checks as needed.  Exercise Prescription Changes:   Exercise Prescription Changes     Row Name 01/20/24 1100             Response to Exercise   Blood Pressure (Admit) 116/66       Blood Pressure (Exercise) 130/70       Blood Pressure (Exit) 122/64       Heart Rate (Admit) 78 bpm       Heart Rate (Exercise) 100 bpm       Heart Rate (Exit) 94 bpm       Oxygen Saturation (Admit) 92 %  RA       Oxygen Saturation (Exercise) 90 %  2L       Oxygen Saturation (Exit) 94 %  2L       Rating of Perceived Exertion (Exercise) 12       Perceived Dyspnea (Exercise) 1       Duration Continue with 30 min of aerobic exercise without signs/symptoms of physical distress.       Intensity THRR unchanged         Progression   Progression Continue to progress workloads to maintain intensity without signs/symptoms of physical distress.         Resistance Training   Weight blue bands       Reps 10-15       Time 10  Minutes         Oxygen   Oxygen Continuous  Liters 2         NuStep   Level 2       Minutes 15       METs 1.7         Track   Laps 16       Minutes 15       METs 2.72         Oxygen   Maintain Oxygen Saturation 88% or higher          Exercise Comments:   Exercise Goals and Review:   Exercise Goals     Row Name 01/14/24 1055             Exercise Goals   Increase Physical Activity Yes       Intervention Provide advice, education, support and counseling about physical activity/exercise needs.;Develop an individualized exercise prescription for aerobic and resistive training based on initial evaluation findings, risk stratification, comorbidities and participant's personal goals.       Expected Outcomes Short Term: Attend rehab on a regular basis to increase amount of physical activity.;Long Term: Add in home exercise to make exercise part of routine and to increase amount of physical activity.;Long Term: Exercising regularly at least 3-5 days a week.       Increase Strength and Stamina Yes       Intervention Provide advice, education, support and counseling about physical activity/exercise needs.;Develop an individualized exercise prescription for aerobic and resistive training based on initial evaluation findings, risk stratification, comorbidities and participant's personal goals.       Expected Outcomes Short Term: Increase workloads from initial exercise prescription for resistance, speed, and METs.;Short Term: Perform resistance training exercises routinely during rehab and add in resistance training at home;Long Term: Improve cardiorespiratory fitness, muscular endurance and strength as measured by increased METs and functional capacity ( )       Able to understand and use rate of perceived exertion (RPE) scale Yes       Intervention Provide education and explanation on how to use RPE scale       Expected Outcomes Short Term: Able to use RPE daily in rehab to  express subjective intensity level;Long Term:  Able to use RPE to guide intensity level when exercising independently       Able to understand and use Dyspnea scale Yes       Intervention Provide education and explanation on how to use Dyspnea scale       Expected Outcomes Short Term: Able to use Dyspnea scale daily in rehab to express subjective sense of shortness of breath during exertion;Long Term: Able to use Dyspnea scale to guide intensity level when exercising independently       Knowledge and understanding of Target Heart Rate Range (THRR) Yes       Intervention Provide education and explanation of THRR including how the numbers were predicted and where they are located for reference       Expected Outcomes Short Term: Able to state/look up THRR;Long Term: Able to use THRR to govern intensity when exercising independently;Short Term: Able to use daily as guideline for intensity in rehab       Understanding of Exercise Prescription Yes       Intervention Provide education, explanation, and written materials on patient's individual exercise prescription       Expected Outcomes Short Term: Able to explain program exercise prescription;Long Term: Able to explain home exercise prescription to exercise independently  Exercise Goals Re-Evaluation :  Exercise Goals Re-Evaluation     Row Name 01/16/24 1055             Exercise Goal Re-Evaluation   Exercise Goals Review Increase Physical Activity;Able to understand and use Dyspnea scale;Understanding of Exercise Prescription;Increase Strength and Stamina;Knowledge and understanding of Target Heart Rate Range (THRR);Able to understand and use rate of perceived exertion (RPE) scale       Comments [name] is scheduled to begin exercise on 12/23.       Expected Outcomes Through exercise at rehab and home, the patient will decrease shortness of breath with daily activities and feel confident in carrying out an exercise regimen at home.           Discharge Exercise Prescription (Final Exercise Prescription Changes):  Exercise Prescription Changes - 01/20/24 1100       Response to Exercise   Blood Pressure (Admit) 116/66    Blood Pressure (Exercise) 130/70    Blood Pressure (Exit) 122/64    Heart Rate (Admit) 78 bpm    Heart Rate (Exercise) 100 bpm    Heart Rate (Exit) 94 bpm    Oxygen Saturation (Admit) 92 %   RA   Oxygen Saturation (Exercise) 90 %   2L   Oxygen Saturation (Exit) 94 %   2L   Rating of Perceived Exertion (Exercise) 12    Perceived Dyspnea (Exercise) 1    Duration Continue with 30 min of aerobic exercise without signs/symptoms of physical distress.    Intensity THRR unchanged      Progression   Progression Continue to progress workloads to maintain intensity without signs/symptoms of physical distress.      Resistance Training   Weight blue bands    Reps 10-15    Time 10 Minutes      Oxygen   Oxygen Continuous    Liters 2      NuStep   Level 2    Minutes 15    METs 1.7      Track   Laps 16    Minutes 15    METs 2.72      Oxygen   Maintain Oxygen Saturation 88% or higher          Nutrition:  Target Goals: Understanding of nutrition guidelines, daily intake of sodium 1500mg , cholesterol 200mg , calories 30% from fat and 7% or less from saturated fats, daily to have 5 or more servings of fruits and vegetables.  Biometrics:    Nutrition Therapy Plan and Nutrition Goals:  Nutrition Therapy & Goals - 01/20/24 1257       Nutrition Therapy   Diet Regular      Personal Nutrition Goals   Nutrition Goal Patient to improve diet quality by using the plate method as a guide for meal planning to include lean protein/plant protein, fruits, vegetables, whole grains, nonfat dairy as part of a well-balanced diet.    Comments Patient with history of severe COPD. Current BMI 29.7 kg/m2 (overweight category). Pt with 6.7% wt gain over past 4.5 months; wt change may be partly skewed by fluid  shift. Pt describes diet as consisting mostly of junk. Typically consumes sausage with white bread or sugary cereal for breakfast, hotdogs/fried chicken for lunch and hamburger for dinner. Snacks usually consist of easy-to-carry foods such as chips. RD provided suggestions for incorporating more plant-based, higher fiber foods into diet. Pt planning to switch out chips for nuts. Patient will benefit from participation in pulmonary rehab for nutrition  education, exercise, and lifestyle modification.      Intervention Plan   Intervention Prescribe, educate and counsel regarding individualized specific dietary modifications aiming towards targeted core components such as weight, hypertension, lipid management, diabetes, heart failure and other comorbidities.;Nutrition handout(s) given to patient.   Handout: Nutrition Tips for Better Breathing   Expected Outcomes Short Term Goal: Understand basic principles of dietary content, such as calories, fat, sodium, cholesterol and nutrients.;Long Term Goal: Adherence to prescribed nutrition plan.          Nutrition Assessments:  MEDIFICTS Score Key: >=70 Need to make dietary changes  40-70 Heart Healthy Diet <= 40 Therapeutic Level Cholesterol Diet  Flowsheet Row PULMONARY REHAB CHRONIC OBSTRUCTIVE PULMONARY DISEASE from 01/20/2024 in [facility] for Heart, Vascular, & Lung Health  Picture Your Plate Total Score on Admission 26   Picture Your Plate Scores: <59 Unhealthy dietary pattern with much room for improvement. 41-50 Dietary pattern unlikely to meet recommendations for good health and room for improvement. 51-60 More healthful dietary pattern, with some room for improvement.  >60 Healthy dietary pattern, although there may be some specific behaviors that could be improved.    Nutrition Goals Re-Evaluation:   Nutrition Goals Discharge (Final Nutrition Goals Re-Evaluation):   Psychosocial: Target Goals: Acknowledge  presence or absence of significant depression and/or stress, maximize coping skills, provide positive support system. Participant is able to verbalize types and ability to use techniques and skills needed for reducing stress and depression.  Initial Review & Psychosocial Screening:  Initial Psych Review & Screening - 01/14/24 1035       Initial Review   Current issues with None Identified      Family Dynamics   Good Support System? Yes    Comments Spouse and son      Barriers   Psychosocial barriers to participate in program There are no identifiable barriers or psychosocial needs.      Screening Interventions   Interventions Encouraged to exercise          Quality of Life Scores:  Scores of 19 and below usually indicate a poorer quality of life in these areas.  A difference of  2-3 points is a clinically meaningful difference.  A difference of 2-3 points in the total score of the Quality of Life Index has been associated with significant improvement in overall quality of life, self-image, physical symptoms, and general health in studies assessing change in quality of life.  PHQ-9: Review Flowsheet       01/14/2024  Depression screen PHQ 2/9  Decreased Interest 1  Down, Depressed, Hopeless 0  PHQ - 2 Score 1  Altered sleeping 0  Tired, decreased energy 1  Change in appetite 0  Feeling bad or failure about yourself  1  Trouble concentrating 0  Moving slowly or fidgety/restless 0  Suicidal thoughts 0  PHQ-9 Score 3  Difficult doing work/chores Not difficult at all   Interpretation of Total Score  Total Score Depression Severity:  1-4 = Minimal depression, 5-9 = Mild depression, 10-14 = Moderate depression, 15-19 = Moderately severe depression, 20-27 = Severe depression   Psychosocial Evaluation and Intervention:  Psychosocial Evaluation - 01/14/24 1036       Psychosocial Evaluation & Interventions   Interventions Encouraged to exercise with the program and follow  exercise prescription    Comments [name] denies any psychosocial barriers at this time.    Expected Outcomes For [name] to participate in PR free of concerns.  Continue Psychosocial Services  No Follow up required          Psychosocial Re-Evaluation:  Psychosocial Re-Evaluation     Row Name 01/16/24 [phone]             Psychosocial Re-Evaluation   Current issues with None Identified       Comments [name] is scheduled to start PR on 01/20/24. No new barriers or concerns at this time.       Expected Outcomes For [name] to participate in PR free of any concerns       Interventions Encouraged to attend Pulmonary Rehabilitation for the exercise       Continue Psychosocial Services  No Follow up required          Psychosocial Discharge (Final Psychosocial Re-Evaluation):  Psychosocial Re-Evaluation - 01/16/24 0934       Psychosocial Re-Evaluation   Current issues with None Identified    Comments [name] is scheduled to start PR on 01/20/24. No new barriers or concerns at this time.    Expected Outcomes For [name] to participate in PR free of any concerns    Interventions Encouraged to attend Pulmonary Rehabilitation for the exercise    Continue Psychosocial Services  No Follow up required          Education: Education Goals: Education classes will be provided on a weekly basis, covering required topics. Participant will state understanding/return demonstration of topics presented.  Learning Barriers/Preferences:  Learning Barriers/Preferences - 01/14/24 1037       Learning Barriers/Preferences   Learning Barriers Sight    Learning Preferences None          Education Topics: Know Your Numbers Group instruction that is supported by a PowerPoint presentation. Instructor discusses importance of knowing and understanding resting, exercise, and post-exercise oxygen saturation, heart rate, and blood pressure. Oxygen saturation, heart rate, blood pressure, rating of perceived exertion, and  dyspnea are reviewed along with a normal range for these values.    Exercise for the Pulmonary Patient Group instruction that is supported by a PowerPoint presentation. Instructor discusses benefits of exercise, core components of exercise, frequency, duration, and intensity of an exercise routine, importance of utilizing pulse oximetry during exercise, safety while exercising, and options of places to exercise outside of rehab.    MET Level  Group instruction provided by PowerPoint, verbal discussion, and written material to support subject matter. Instructor reviews what METs are and how to increase METs.    Pulmonary Medications Verbally interactive group education provided by instructor with focus on inhaled medications and proper administration.   Anatomy and Physiology of the Respiratory System Group instruction provided by PowerPoint, verbal discussion, and written material to support subject matter. Instructor reviews respiratory cycle and anatomical components of the respiratory system and their functions. Instructor also reviews differences in obstructive and restrictive respiratory diseases with examples of each.    Oxygen Safety Group instruction provided by PowerPoint, verbal discussion, and written material to support subject matter. There is an overview of What is Oxygen and Why do we need it.  Instructor also reviews how to create a safe environment for oxygen use, the importance of using oxygen as prescribed, and the risks of noncompliance. There is a brief discussion on traveling with oxygen and resources the patient may utilize.   Oxygen Use Group instruction provided by PowerPoint, verbal discussion, and written material to discuss how supplemental oxygen is prescribed and different types of oxygen supply systems. Resources for more information are provided.  Breathing Techniques Group instruction that is supported by demonstration and informational handouts.  Instructor discusses the benefits of pursed lip and diaphragmatic breathing and detailed demonstration on how to perform both.     Risk Factor Reduction Group instruction that is supported by a PowerPoint presentation. Instructor discusses the definition of a risk factor, different risk factors for pulmonary disease, and how the heart and lungs work together.   Pulmonary Diseases Group instruction provided by PowerPoint, verbal discussion, and written material to support subject matter. Instructor gives an overview of the different type of pulmonary diseases. There is also a discussion on risk factors and symptoms as well as ways to manage the diseases.   Stress and Energy Conservation Group instruction provided by PowerPoint, verbal discussion, and written material to support subject matter. Instructor gives an overview of stress and the impact it can have on the body. Instructor also reviews ways to reduce stress. There is also a discussion on energy conservation and ways to conserve energy throughout the day.   Warning Signs and Symptoms Group instruction provided by PowerPoint, verbal discussion, and written material to support subject matter. Instructor reviews warning signs and symptoms of stroke, heart attack, cold and flu. Instructor also reviews ways to prevent the spread of infection.   Other Education Group or individual verbal, written, or video instructions that support the educational goals of the pulmonary rehab program.    Knowledge Questionnaire Score:  Knowledge Questionnaire Score - 01/14/24 1200       Knowledge Questionnaire Score   Pre Score 16/18          Core Components/Risk Factors/Patient Goals at Admission:  Personal Goals and Risk Factors at Admission - 01/14/24 1037       Core Components/Risk Factors/Patient Goals on Admission   Improve shortness of breath with ADL's Yes    Intervention Provide education, individualized exercise plan and daily  activity instruction to help decrease symptoms of SOB with activities of daily living.    Expected Outcomes Short Term: Improve cardiorespiratory fitness to achieve a reduction of symptoms when performing ADLs;Long Term: Be able to perform more ADLs without symptoms or delay the onset of symptoms    Increase knowledge of respiratory medications and ability to use respiratory devices properly  Yes    Intervention Provide education and demonstration as needed of appropriate use of medications, inhalers, and oxygen therapy.    Expected Outcomes Short Term: Achieves understanding of medications use. Understands that oxygen is a medication prescribed by physician. Demonstrates appropriate use of inhaler and oxygen therapy.;Long Term: Maintain appropriate use of medications, inhalers, and oxygen therapy.          Core Components/Risk Factors/Patient Goals Review:   Goals and Risk Factor Review     Row Name 01/16/24 0935             Core Components/Risk Factors/Patient Goals Review   Personal Goals Review Improve shortness of breath with ADL's;Develop more efficient breathing techniques such as purse lipped breathing and diaphragmatic breathing and practicing self-pacing with activity.;Increase knowledge of respiratory medications and ability to use respiratory devices properly.       Review [name] has not started PR class yet. Unable to asses his goals at this time.       Expected Outcomes To improve shortness of breath with ADL's, increase knowledge of respiratory medications and ability to use respiratory devices properly and develop more efficient breathing techniques such as purse lipped breathing and diaphragmatic breathing; and practicing self-pacing with activity.  Core Components/Risk Factors/Patient Goals at Discharge (Final Review):   Goals and Risk Factor Review - 01/16/24 0935       Core Components/Risk Factors/Patient Goals Review   Personal Goals Review Improve shortness  of breath with ADL's;Develop more efficient breathing techniques such as purse lipped breathing and diaphragmatic breathing and practicing self-pacing with activity.;Increase knowledge of respiratory medications and ability to use respiratory devices properly.    Review [name] has not started PR class yet. Unable to asses his goals at this time.    Expected Outcomes To improve shortness of breath with ADL's, increase knowledge of respiratory medications and ability to use respiratory devices properly and develop more efficient breathing techniques such as purse lipped breathing and diaphragmatic breathing; and practicing self-pacing with activity.          ITP Comments:Pt is making expected progress toward Pulmonary Rehab goals after completing 1 session(s). Recommend continued exercise, life style modification, education, and utilization of breathing techniques to increase stamina and strength, while also decreasing shortness of breath with exertion.  Dr. Slater Staff is Medical Director for Pulmonary Rehab at [facility].     Comments:      [1]  Current Outpatient Medications:    albuterol  (VENTOLIN  HFA) 108 (90 Base) MCG/ACT inhaler, Inhale 2 puffs into the lungs every 6 (six) hours as needed for wheezing or shortness of breath (Use when out of your home or if unable to reach your nebulizer machine.)., Disp: , Rfl:    alendronate (FOSAMAX) 70 MG tablet, Take 70 mg by mouth every Monday. (Patient taking differently: Take 70 mg by mouth every Monday. PRN), Disp: , Rfl:    azithromycin  (ZITHROMAX ) 250 MG tablet, Take 1 tablet (250 mg total) by mouth 3 (three) times a week., Disp: 36 tablet, Rfl: 3   budesonide -glycopyrrolate -formoterol  (BREZTRI  AEROSPHERE) 160-9-4.8 MCG/ACT AERO inhaler, Inhale 2 puffs into the lungs every 12 (twelve) hours., Disp: 32.1 g, Rfl: 0   Cholecalciferol (VITAMIN D3 MAXIMUM STRENGTH) 125 MCG (5000 UT) capsule, Take 5,000 Units by mouth daily. (Patient not  taking: Reported on 01/08/2024), Disp: , Rfl:    ferrous sulfate 325 (65 FE) MG EC tablet, Take 325 mg by mouth daily., Disp: , Rfl:    furosemide (LASIX) 80 MG tablet, Take 160 mg by mouth 2 (two) times daily., Disp: , Rfl:    HYDROcodone -acetaminophen  (NORCO) 7.5-325 MG tablet, Take 1 tablet by mouth every 4 (four) hours as needed for severe pain (pain score 7-10)., Disp: , Rfl:    ibuprofen (ADVIL) 200 MG tablet, Take 400 mg by mouth every 6 (six) hours as needed for moderate pain (pain score 4-6)., Disp: , Rfl:    ipratropium (ATROVENT ) 0.06 % nasal spray, Can use two sprays in each nostril every six hours if needed to dry up nose., Disp: 15 mL, Rfl: 5   ipratropium-albuterol  (DUONEB) 0.5-2.5 (3) MG/3ML SOLN, Take 3 mLs by nebulization every 4 (four) hours as needed (shortness of breath or wheezing. Use when at home.)., Disp: 360 mL, Rfl: 0   Multiple Vitamin (MULTIVITAMIN ADULT PO), Take by mouth daily., Disp: , Rfl:    OHTUVAYRE  3 MG/2.5ML SUSP, Inhale 2.5 mLs into the lungs 2 (two) times daily., Disp: , Rfl:    omeprazole  (PRILOSEC) 40 MG capsule, Take 1 capsule (40 mg total) by mouth daily. Call for appointment for future refills, Disp: 90 capsule, Rfl: 0   sildenafil (VIAGRA) 100 MG tablet, Take 100 mg by mouth daily as needed., Disp: , Rfl:  traZODone  (DESYREL ) 150 MG tablet, Take 0.5-1 tablets (75-150 mg total) by mouth at bedtime for sleep or mood., Disp: 90 tablet, Rfl: 3 [2]  Social History Tobacco Use  Smoking Status Former   Current packs/day: 0.00   Average packs/day: 0.3 packs/day for 52.6 years (13.1 ttl pk-yrs)   Types: Cigarettes   Start date: 09/1970   Quit date: 04/2023   Years since quitting: 0.7  Smokeless Tobacco Never  Tobacco Comments   Quit smoking in April 2025.

## 2024-01-25 ENCOUNTER — Other Ambulatory Visit (HOSPITAL_COMMUNITY): Payer: Self-pay

## 2024-01-27 ENCOUNTER — Encounter (HOSPITAL_COMMUNITY)
Admission: RE | Admit: 2024-01-27 | Discharge: 2024-01-27 | Disposition: A | Discharge status: Home / Self Care | Source: Ambulatory Visit | Attending: Pulmonary Disease | Admitting: Pulmonary Disease

## 2024-01-27 DIAGNOSIS — J449 Chronic obstructive pulmonary disease, unspecified: Secondary | ICD-10-CM | POA: Diagnosis not present

## 2024-01-27 NOTE — Progress Notes (Signed)
 Daily Session Note  Patient Details  Name: Austin Glass MRN: [id number] Date of Birth: 1955/11/30 Referring Provider:   Conrad Ports Pulmonary Rehab Walk Test from 01/14/2024 in [facility] for Heart, Vascular, & Lung Health  Referring Provider Mannam    Encounter Date: 01/27/2024  Check In:  Session Check In - 01/27/24 1025       Check-In   Supervising physician immediately available to respond to emergencies [facility] MD immediately available    Physician(s) Orren Fabry, NP    Location [facility]    Staff Present Ronal Levin, RN, BSN;Rachella Basden Midge BS, ACSM-CEP, Exercise Physiologist;Kaylee Nicholaus, MS, ACSM-CEP, Exercise Physiologist    Virtual Visit No    Medication changes reported     No    Fall or balance concerns reported    No    Tobacco Cessation No Change    Warm-up and Cool-down Performed as group-led instruction    Resistance Training Performed Yes    VAD Patient? No    PAD/SET Patient? No      Pain Assessment   Currently in Pain? No/denies          Capillary Blood Glucose: No results found for this or any previous visit (from the past 24 hours).    Tobacco Use History[1]  Goals Met:  Exercise tolerated well No report of concerns or symptoms today Strength training completed today  Goals Unmet:  Not Applicable  Comments: Service time is from 1008 to 1130.    Dr. Slater Staff is Medical Director for Pulmonary Rehab at [facility]     [1]  Social History Tobacco Use  Smoking Status Former   Current packs/day: 0.00   Average packs/day: 0.3 packs/day for 52.6 years (13.1 ttl pk-yrs)   Types: Cigarettes   Start date: 09/1970   Quit date: 04/2023   Years since quitting: 0.7  Smokeless Tobacco Never  Tobacco Comments   Quit smoking in April 2025.

## 2024-02-03 ENCOUNTER — Encounter (HOSPITAL_COMMUNITY)
Admission: RE | Admit: 2024-02-03 | Discharge: 2024-02-03 | Disposition: A | Discharge status: Home / Self Care | Payer: Self-pay | Source: Ambulatory Visit | Attending: Pulmonary Disease | Admitting: Pulmonary Disease

## 2024-02-03 VITALS — Wt 189.4 lb

## 2024-02-03 DIAGNOSIS — J449 Chronic obstructive pulmonary disease, unspecified: Secondary | ICD-10-CM | POA: Insufficient documentation

## 2024-02-03 NOTE — Progress Notes (Signed)
 Daily Session Note  Patient Details  Name: JOVAUGHN WOJTASZEK MRN: [id number] Date of Birth: 11-Dec-1955 Referring Provider:   Conrad Ports Pulmonary Rehab Walk Test from 01/14/2024 in [facility] for Heart, Vascular, & Lung Health  Referring Provider Mannam    Encounter Date: 02/03/2024  Check In:  Session Check In - 02/03/24 1024       Check-In   Supervising physician immediately available to respond to emergencies [facility] MD immediately available    Physician(s) Rosabel Finder, NP    [facility]    Staff Present Ronal Levin, RN, BSN;Randi Midge BS, ACSM-CEP, Exercise Physiologist;Kaylee Nicholaus, MS, ACSM-CEP, Exercise Physiologist;Casey Claudene, RT    Virtual Visit No    Medication changes reported     No    Fall or balance concerns reported    No    Tobacco Cessation No Change    Warm-up and Cool-down Performed as group-led instruction    Resistance Training Performed Yes    VAD Patient? No    PAD/SET Patient? No      Pain Assessment   Currently in Pain? No/denies    Multiple Pain Sites No          Capillary Blood Glucose: No results found for this or any previous visit (from the past 24 hours).   Exercise Prescription Changes - 02/03/24 1100       Response to Exercise   Blood Pressure (Admit) 120/66    Blood Pressure (Exercise) 140/70    Blood Pressure (Exit) 104/60    Heart Rate (Admit) 101 bpm    Heart Rate (Exercise) 123 bpm    Heart Rate (Exit) 104 bpm    Oxygen Saturation (Admit) 90 %   RA   Oxygen Saturation (Exercise) 89 %   2L   Oxygen Saturation (Exit) 90 %   2L   Rating of Perceived Exertion (Exercise) 13    Perceived Dyspnea (Exercise) 2    Duration Continue with 30 min of aerobic exercise without signs/symptoms of physical distress.    Intensity THRR unchanged      Progression   Progression Continue to progress workloads to maintain intensity without signs/symptoms of physical distress.      Resistance  Training   Weight blue bands    Reps 10-15    Time 10 Minutes      Oxygen   Oxygen Continuous    Liters 2      NuStep   Level 3    SPM 74    Minutes 15    METs 2.3      Track   Laps 18    Minutes 15    METs 3.25      Oxygen   Maintain Oxygen Saturation 88% or higher          Tobacco Use History[1]  Goals Met:  Independence with exercise equipment Exercise tolerated well Queuing for purse lip breathing No report of concerns or symptoms today Strength training completed today  Goals Unmet:  Not Applicable  Comments: Service time is from 1008 to 1131    Dr. Slater Staff is Medical Director for Pulmonary Rehab at [facility].     [1]  Social History Tobacco Use  Smoking Status Former   Current packs/day: 0.00   Average packs/day: 0.3 packs/day for 52.6 years (13.1 ttl pk-yrs)   Types: Cigarettes   Start date: 09/1970   Quit date: 04/2023   Years since quitting: 0.7  Smokeless  Tobacco Never  Tobacco Comments   Quit smoking in April 2025.

## 2024-02-05 ENCOUNTER — Other Ambulatory Visit: Payer: Self-pay

## 2024-02-05 ENCOUNTER — Encounter (HOSPITAL_COMMUNITY)
Admission: RE | Admit: 2024-02-05 | Discharge: 2024-02-05 | Disposition: A | Discharge status: Home / Self Care | Payer: Self-pay | Source: Ambulatory Visit | Attending: Pulmonary Disease | Admitting: Pulmonary Disease

## 2024-02-05 DIAGNOSIS — J449 Chronic obstructive pulmonary disease, unspecified: Secondary | ICD-10-CM | POA: Diagnosis not present

## 2024-02-05 NOTE — Progress Notes (Signed)
 Daily Session Note  Patient Details  Name: Austin Glass MRN: [id number] Date of Birth: [birth year] Referring Provider:   Conrad Ports Pulmonary Rehab Walk Test from 01/14/2024 in [facility] for Heart, Vascular, & Lung Health  Referring Provider Mannam    Encounter Date: 02/05/2024  Check In:  Session Check In - 02/05/24 1028       Check-In   Supervising physician immediately available to respond to emergencies [facility] immediately available    Physician(s) Damien Braver, NP    [facility]    Staff Present Ronal Levin, RN, BSN;Jalan Fariss Midge BS, ACSM-CEP, Exercise Physiologist;Casey Claudene, RT    Virtual Visit No    Medication changes reported     No    Fall or balance concerns reported    No    Tobacco Cessation No Change    Warm-up and Cool-down Performed as group-led instruction    Resistance Training Performed Yes    VAD Patient? No    PAD/SET Patient? No      Pain Assessment   Currently in Pain? No/denies          Capillary Blood Glucose: No results found for this or any previous visit (from the past 24 hours).    Tobacco Use History[1]  Goals Met:  Exercise tolerated well No report of concerns or symptoms today Strength training completed today  Goals Unmet:  Not Applicable  Comments: Service time is from 1004 to 1136.    Dr. Slater Staff is Medical Director for Pulmonary Rehab at [facility].     [1]  Social History Tobacco Use  Smoking Status Former   Current packs/day: 0.00   Average packs/day: 0.3 packs/day for 52.6 years (13.1 ttl pk-yrs)   Types: Cigarettes   Start date: 09/1970   Quit date: 04/2023   Years since quitting: 0.7  Smokeless Tobacco Never  Tobacco Comments   Quit smoking in April 2025.

## 2024-02-10 ENCOUNTER — Encounter (HOSPITAL_COMMUNITY): Admission: RE | Admit: 2024-02-10 | Payer: Self-pay | Source: Ambulatory Visit

## 2024-02-12 ENCOUNTER — Telehealth (HOSPITAL_COMMUNITY): Payer: Self-pay

## 2024-02-12 ENCOUNTER — Encounter (HOSPITAL_COMMUNITY)
Admission: RE | Admit: 2024-02-12 | Discharge: 2024-02-12 | Disposition: A | Discharge status: Home / Self Care | Payer: Self-pay | Source: Ambulatory Visit | Attending: Pulmonary Disease | Admitting: Pulmonary Disease

## 2024-02-12 VITALS — Wt 181.2 lb

## 2024-02-12 DIAGNOSIS — J449 Chronic obstructive pulmonary disease, unspecified: Secondary | ICD-10-CM | POA: Diagnosis not present

## 2024-02-12 NOTE — Telephone Encounter (Signed)
 Pt insurance is active and benefits verified through [facility] Co-pay 50.00, DED 0/0 met, out of pocket 3950.00/0 met, co-insurance 0. No pre-authorization required. Passport, 02/12/2024 at 12:37:24 PM, REF# [id number]

## 2024-02-12 NOTE — Progress Notes (Signed)
 Daily Session Note  Patient Details  Name: Austin Glass MRN: [id number] Date of Birth: [birth year] Referring Provider:   Conrad Ports Pulmonary Rehab Walk Test from 01/14/2024 in [facility] for Heart, Vascular, & Lung Health  Referring Provider Mannam    Encounter Date: 02/12/2024  Check In:  Session Check In - 02/12/24 1030       Check-In   Supervising physician immediately available to respond to emergencies [facility] MD immediately available    Physician(s) Lum Louis, NP    [facility]    Staff Present Ronal Levin, RN, BSN;Sharlette Jansma BS, ACSM-CEP, Exercise Physiologist;Casey Claudene, RT    Virtual Visit No    Medication changes reported     No    Fall or balance concerns reported    No    Tobacco Cessation No Change    Warm-up and Cool-down Performed as group-led instruction    Resistance Training Performed Yes    VAD Patient? No    PAD/SET Patient? No      Pain Assessment   Currently in Pain? No/denies    Multiple Pain Sites No          Capillary Blood Glucose: No results found for this or any previous visit (from the past 24 hours).    Tobacco Use History[1]  Goals Met:  Proper associated with RPD/PD & O2 Sat Independence with exercise equipment Exercise tolerated well No report of concerns or symptoms today Strength training completed today  Goals Unmet:  Not Applicable  Comments: Service time is from 1007 to 1140.    Dr. Slater Staff is Medical Director for Pulmonary Rehab at [facility].     [1]  Social History Tobacco Use  Smoking Status Former   Current packs/day: 0.00   Average packs/day: 0.3 packs/day for 52.6 years (13.1 ttl pk-yrs)   Types: Cigarettes   Start date: 09/1970   Quit date: 04/2023   Years since quitting: 0.7  Smokeless Tobacco Never  Tobacco Comments   Quit smoking in April 2025.

## 2024-02-13 ENCOUNTER — Ambulatory Visit
Admission: EM | Admit: 2024-02-13 | Discharge: 2024-02-13 | Disposition: A | Discharge status: Home / Self Care | Attending: Physician Assistant | Admitting: Physician Assistant

## 2024-02-13 ENCOUNTER — Ambulatory Visit (INDEPENDENT_AMBULATORY_CARE_PROVIDER_SITE_OTHER)

## 2024-02-13 DIAGNOSIS — M5489 Other dorsalgia: Secondary | ICD-10-CM | POA: Diagnosis not present

## 2024-02-13 DIAGNOSIS — J441 Chronic obstructive pulmonary disease with (acute) exacerbation: Secondary | ICD-10-CM

## 2024-02-13 MED ORDER — PREDNISONE 50 MG PO TABS: ORAL_TABLET | ORAL | 0 refills | Status: AC

## 2024-02-13 MED ORDER — OXYCODONE-ACETAMINOPHEN 5-325 MG PO TABS: 1.0000 | ORAL_TABLET | ORAL | 0 refills | Status: AC | PRN

## 2024-02-13 MED ORDER — KETOROLAC TROMETHAMINE 60 MG/2ML IM SOLN
30.0000 mg | Freq: Once | INTRAMUSCULAR | Status: AC
  Administered 2024-02-13: 30 mg via INTRAMUSCULAR

## 2024-02-13 NOTE — Discharge Instructions (Addendum)
 Follow up with your Physician for recheck next week.  Prednisone  as directed.  Pain medication as needed

## 2024-02-13 NOTE — ED Triage Notes (Signed)
 Patient present to the office for SOB and back pain. Patient states his pcp called in steroid this morning but he has not started today.

## 2024-02-13 NOTE — ED Provider Notes (Signed)
 " [facility]    CSN: [id number] Arrival date & time: 02/13/24  1524      History   Chief Complaint Chief Complaint  Patient presents with   Shortness of Breath   Back Pain    HPI Austin Glass is a 69 y.o. male.   Patient complains of a cough and congestion.  Patient has a past medical history of COPD.  Patient states that he is followed by pulmonary.  Patient states that he has coughed so hard he is concerned that he could have broken a rib.  Patient also reports he has pain in his back at the level where he has a compression fracture.  Patient is concerned that something could be wrong where he had the compression fracture repaired.  Patient sees Dr. Mavis.  Patient reports he has not had a fever or chills.  Patient denies any exposure to influenza or COVID.  Patient is requesting an x-ray.  The history is provided by the patient and a relative. No language interpreter was used.  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   GERD (gastroesophageal reflux disease) 03/28/2023   History of hemorrhoids 03/28/2023   Hx of adenomatous colonic polyps 01/04/2016   Hypertrophy of nasal turbinates    Insomnia 03/28/2023   Sinus disease    Smoker    SOB (shortness of breath)     Patient Active Problem List   Diagnosis Date Noted   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   Atypical chest pain 04/11/2023   Anxiety    Hypertrophy of nasal turbinates    Sinus disease     Smoker    SOB (shortness of breath)    COPD with acute exacerbation (HCC) 03/28/2023   CAP (community acquired pneumonia) 03/28/2023   History of hemorrhoids 03/28/2023   Continuous dependence on cigarette smoking 03/28/2023   GERD (gastroesophageal reflux disease) 03/28/2023   Insomnia 03/28/2023   COPD exacerbation (HCC) 03/28/2023   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   Hx of adenomatous colonic polyps 01/04/2016    Past Surgical History:  Procedure Laterality Date   CARPAL TUNNEL RELEASE Left    COLONOSCOPY     ETHMOIDECTOMY Bilateral 12/05/2015   Procedure: BILATERAL TOTAL  ETHMOIDECTOMY, MAXILLARY OSTEA  ENLARGEMENT;  Surgeon: Lonni FORBES Angle, MD;  Location: [facility];  Service: ENT;  Laterality: Bilateral;   EYE SURGERY Bilateral    cataract   HERNIA REPAIR     UHR   KYPHOPLASTY N/A 08/07/2023   Procedure: KYPHOPLASTY THORACIC SIX;  Surgeon: Mavis Purchase, MD;  Location[facility];  Service: Neurosurgery;  Laterality: N/A;  Kyphoplasty - T6   SINUS ENDO WITH FUSION Bilateral 12/05/2015   Procedure: FUCTIONAL  ENDOSCOPIC SINUS SURGERY WITH FUSION NAVIGATION;  Surgeon: Lonni FORBES Angle, MD;  Location: [facility];  Service: ENT;  Laterality: Bilateral;   TONSILLECTOMY  TURBINATE REDUCTION Bilateral 12/05/2015   Procedure: BILATERAL TURBINATE REDUCTION;  Surgeon: Lonni FORBES Angle, MD;  Location: [facility];  Service: ENT;  Laterality: Bilateral;   ULNAR NERVE REPAIR Left    decompression   WISDOM TOOTH EXTRACTION         Home Medications    Prior to Admission medications  Medication Sig Start Date End Date Taking? Authorizing Provider  albuterol  (VENTOLIN  HFA) 108 (90 Base) MCG/ACT inhaler Inhale 2 puffs into the lungs every 6 (six) hours as needed for wheezing or shortness of breath (Use when out of your home or if unable to reach your nebulizer machine.). 03/29/23  Yes Briana Elgin LABOR, MD   budesonide -glycopyrrolate -formoterol  (BREZTRI  AEROSPHERE) 160-9-4.8 MCG/ACT AERO inhaler Inhale 2 puffs into the lungs every 12 (twelve) hours. 08/28/23  Yes   Cholecalciferol (VITAMIN D3 MAXIMUM STRENGTH) 125 MCG (5000 UT) capsule Take 5,000 Units by mouth daily.   Yes [provider]  ferrous sulfate 325 (65 FE) MG EC tablet Take 325 mg by mouth daily.   Yes [provider]  HYDROcodone -acetaminophen  (NORCO) 7.5-325 MG tablet Take 1 tablet by mouth every 4 (four) hours as needed for severe pain (pain score 7-10). 07/28/23  Yes [provider]  ibuprofen (ADVIL) 200 MG tablet Take 400 mg by mouth every 6 (six) hours as needed for moderate pain (pain score 4-6).   Yes [provider]  ipratropium (ATROVENT ) 0.06 % nasal spray Can use two sprays in each nostril every six hours if needed to dry up nose. 11/27/23  Yes Kozlow, Camellia PARAS, MD  ipratropium-albuterol  (DUONEB) 0.5-2.5 (3) MG/3ML SOLN Take 3 mLs by nebulization every 4 (four) hours as needed (shortness of breath or wheezing. Use when at home.). 10/23/23  Yes Mannam, Praveen, MD  Multiple Vitamin (MULTIVITAMIN ADULT PO) Take by mouth daily.   Yes [provider]  OHTUVAYRE  3 MG/2.5ML SUSP Inhale 2.5 mLs into the lungs 2 (two) times daily. 07/28/23  Yes [provider]  omeprazole  (PRILOSEC) 40 MG capsule Take 1 capsule (40 mg total) by mouth daily. Call for appointment for future refills 12/30/23  Yes Avram Lupita FORBES, MD  oxyCODONE -acetaminophen  (PERCOCET) 5-325 MG tablet Take 1 tablet by mouth every 4 (four) hours as needed for severe pain (pain score 7-10). 02/13/24 02/12/25 Yes Urho Rio K, PA-C  predniSONE  (DELTASONE ) 50 MG tablet One tablet a day for 5 days 02/13/24  Yes Ken Bonn K, PA-C  sildenafil (VIAGRA) 100 MG tablet Take 100 mg by mouth daily as needed. 10/20/23  Yes [provider]  traZODone  (DESYREL ) 150 MG tablet Take 0.5-1 tablets (75-150 mg total) by mouth at bedtime  for sleep or mood. 11/18/23  Yes   alendronate (FOSAMAX) 70 MG tablet Take 70 mg by mouth every Monday. Patient taking differently: Take 70 mg by mouth every Monday. PRN 06/26/23   [provider]  azithromycin  (ZITHROMAX ) 250 MG tablet Take 1 tablet (250 mg total) by mouth 3 (three) times a week. 10/24/23 10/23/24  Mannam, Praveen, MD  furosemide (LASIX) 80 MG tablet Take 160 mg by mouth 2 (two) times daily. 04/14/23   [provider]    Family History Family History  Problem Relation Age of Onset   Liver cancer Mother    Colon polyps Father    Heart failure Father    Ehlers-Danlos syndrome Sister    Colon cancer Neg Hx    Stomach cancer Neg Hx    Rectal cancer Neg Hx  Pancreatic cancer Neg Hx     Social History Social History[1]   Allergies   Penicillins   Review of Systems Review of Systems  Respiratory:  Positive for shortness of breath.   Musculoskeletal:  Positive for back pain.  All other systems reviewed and are negative.    Physical Exam Triage Vital Signs ED Triage Vitals [02/13/24 1617]  Encounter Vitals Group     BP (!) 159/78     Girls Systolic BP Percentile      Girls Diastolic BP Percentile      Boys Systolic BP Percentile      Boys Diastolic BP Percentile      Pulse Rate 84     Resp 18     Temp 98.1 F (36.7 C)     Temp Source Oral     SpO2      Weight      Height      Head Circumference      Peak Flow      Pain Score      Pain Loc      Pain Education      Exclude from Growth Chart    No data found.  Updated Vital Signs BP (!) 159/78 (BP Location: Left Arm)   Pulse 84   Temp 98.1 F (36.7 C) (Oral)   Resp 18   Visual Acuity Right Eye Distance:   Left Eye Distance:   Bilateral Distance:    Right Eye Near:   Left Eye Near:    Bilateral Near:     Physical Exam Vitals and nursing note reviewed.  Constitutional:      Appearance: He is well-developed.  HENT:     Head: Normocephalic.  Cardiovascular:      Rate and Rhythm: Normal rate.  Pulmonary:     Breath sounds: Decreased breath sounds present.  Abdominal:     General: There is no distension.     Palpations: Abdomen is soft.  Musculoskeletal:        General: Normal range of motion.     Cervical back: Normal range of motion.  Skin:    General: Skin is warm.  Neurological:     General: No focal deficit present.     Mental Status: He is alert and oriented to person, place, and time.      UC Treatments / Results  Labs (all labs ordered are listed, but only abnormal results are displayed) Labs Reviewed - No data to display  EKG   Radiology DG Chest 2 View Result Date: 02/13/2024 EXAM: 2 VIEW(S) XRAY OF THE CHEST 02/13/2024 05:24:07 PM COMPARISON: 05/12/2023 CLINICAL HISTORY: back pain FINDINGS: LUNGS AND PLEURA: Hyperinflated lungs. No focal pulmonary opacity. No pleural effusion. No pneumothorax. HEART AND MEDIASTINUM: No acute abnormality of the cardiac and mediastinal silhouettes. BONES AND SOFT TISSUES: T6 vertebral body augmentation noted. IMPRESSION: 1. No acute cardiopulmonary findings. 2. Hyperinflated lungs. 3. T6 vertebral body augmentation. Electronically signed by: Greig Pique MD 02/13/2024 05:48 PM EST RP Workstation: [id number]    Procedures Procedures (including critical care time)  Medications Ordered in UC Medications  ketorolac  (TORADOL ) injection 30 mg (30 mg Intramuscular Given 02/13/24 1712)    Initial Impression / Assessment and Plan / UC Course  I have reviewed the triage vital signs and the nursing notes.  Pertinent labs & imaging results that were available during my care of the patient were reviewed by me and considered in my medical decision making (see chart for details).  Chest x-ray shows hyperinflation of lungs, no obvious pneumonia.  Compression fracture with vertebral augmentation is noted, no obvious new compression. Final Clinical Impressions(s) / UC Diagnoses   Final diagnoses:   Other acute back pain  COPD exacerbation [facility])     Discharge Instructions      Follow up with your Physician for recheck next week.  Prednisone  as directed.  Pain medication as needed    ED Prescriptions     Medication Sig Dispense Auth. Provider   predniSONE  (DELTASONE ) 50 MG tablet One tablet a day for 5 days 5 tablet Diedra Sinor K, PA-C   oxyCODONE -acetaminophen  (PERCOCET) 5-325 MG tablet Take 1 tablet by mouth every 4 (four) hours as needed for severe pain (pain score 7-10). 20 tablet Valissa Lyvers K, PA-C      PDMP not reviewed this encounter. Patient is given a prescription for prednisone  and oxycodone .  Patient is advised of the limitations of urgent care imaging.  He is advised to see his primary care physician for recheck if pain persist.  Patient is evaluated he may need more advanced imaging to make sure that he does not have a new compression fracture.  Patient is discharged in stable condition with follow-up instructions An After Visit Summary was printed and given to the patient.        [1]  Social History Tobacco Use   Smoking status: Former    Current packs/day: 0.00    Average packs/day: 0.3 packs/day for 52.6 years (13.1 ttl pk-yrs)    Types: Cigarettes    Start date: 09/1970    Quit date: 04/2023    Years since quitting: 0.7   Smokeless tobacco: Never   Tobacco comments:    Quit smoking in April 2025.  Vaping Use   Vaping status: Some Days  Substance Use Topics   Alcohol use: Yes    Comment: once monthly, beer/mixed drink   Drug use: No     [place] Sonny POUR, PA-C 02/13/24 1930  "

## 2024-02-17 ENCOUNTER — Encounter (HOSPITAL_COMMUNITY): Admission: RE | Admit: 2024-02-17 | Payer: Self-pay | Source: Ambulatory Visit

## 2024-02-18 ENCOUNTER — Other Ambulatory Visit: Payer: Self-pay

## 2024-02-18 ENCOUNTER — Other Ambulatory Visit (HOSPITAL_BASED_OUTPATIENT_CLINIC_OR_DEPARTMENT_OTHER): Payer: Self-pay

## 2024-02-18 NOTE — Progress Notes (Signed)
 Pulmonary Individual Treatment Plan  Patient Details  Name: Austin Glass MRN: [id number] Date of Birth: 10/22/1955 Referring Provider:   Conrad Ports Pulmonary Rehab Walk Test from 01/14/2024 in [facility]  Referring Provider Mannam    Initial Encounter Date:  Flowsheet Row Pulmonary Rehab Walk Test from 01/14/2024 in [facility], [facility]  Date 01/14/24    Visit Diagnosis: Stage 3 severe COPD by GOLD classification (HCC)  Patient's Home Medications on Admission:  Current Medications[1]  Past Medical History: 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   Dyspnea    with exertion   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   GERD (gastroesophageal reflux disease) 03/28/2023   History of hemorrhoids 03/28/2023   Hx of adenomatous colonic polyps 01/04/2016   Hypertrophy of nasal turbinates    Insomnia 03/28/2023   Sinus disease    Smoker    SOB (shortness of breath)     Tobacco Use: Tobacco Use History[2]  Labs: Review Flowsheet        No data to display          Capillary Blood Glucose: No results found for: GLUCAP   Pulmonary Assessment Scores:  Pulmonary Assessment Scores     Row Name 01/14/24 1045 01/14/24 1200       ADL UCSD   ADL Phase Entry Entry    SOB Score total -- 34      CAT Score   CAT Score -- 11      mMRC Score   mMRC Score -- 3      UCSD: Self-administered rating of dyspnea associated with activities of daily living  (ADLs) 6-point scale (0 = not at all to 5 = maximal or unable to do because of breathlessness)  Scoring Scores range from 0 to 120.  Minimally important difference is 5 units  CAT: CAT can identify the health impairment of COPD patients and is better correlated with disease progression.  CAT has a scoring range of zero to 40. The CAT score is classified into four groups of low (less than 10), medium (10 - 20), high (21-30) and very high (31-40) based on the impact level of disease on health status. A CAT score over 10 suggests significant symptoms.  A worsening CAT score could be explained by an exacerbation, poor medication adherence, poor inhaler technique, or progression of COPD or comorbid conditions.  CAT MCID is 2 points  mMRC: mMRC (Modified Medical Research Council) Dyspnea Scale is used to assess the degree of baseline functional disability in patients of respiratory disease due to dyspnea. No minimal important difference is established. A decrease in score of 1 point or greater is considered a positive change.   Pulmonary Function Assessment:  Pulmonary Function Assessment - 01/14/24 1454       Breath   Bilateral Breath Sounds Rhonchi    Shortness of Breath Yes;Limiting activity          Exercise Target Goals:  Exercise Program Goal: Individual exercise prescription set using results from initial 6 min walk test and THRR while considering  patients activity barriers and safety.   Exercise Prescription Goal: Initial exercise prescription builds to 30-45 minutes a day of aerobic activity, 2-3 days per week.  Home exercise guidelines will be given to patient during program as part of exercise prescription that the participant will acknowledge.  Activity Barriers & Risk Stratification:  Activity Barriers & Cardiac Risk Stratification - 01/14/24 1453       Activity Barriers & Cardiac Risk Stratification   Activity Barriers Muscular Weakness;Shortness of  Breath;Deconditioning    Cardiac Risk Stratification Low          6 Minute Walk:  6 Minute Walk     Row Name 01/14/24 1203         6 Minute Walk   Phase Initial     Distance 1381 feet     Walk Time 6 minutes     # of Rest Breaks 0     MPH 2.62     METS 2.82     RPE 11     Perceived Dyspnea  1     VO2 Peak 9.88     Symptoms No     Resting HR 87 bpm     Resting BP 112/62     Resting Oxygen Saturation  90 %     Exercise Oxygen Saturation  during 6 min walk 86 %     Max Ex. HR 102 bpm     Max Ex. BP 114/62     2 Minute Post BP 110/60       Interval HR   1 Minute HR 70     2 Minute HR 92     3 Minute HR 98     4 Minute HR 101     5 Minute HR 102     6 Minute HR 92     2 Minute Post HR 74     Interval Heart Rate? Yes       Interval Oxygen   Interval Oxygen? Yes     Baseline Oxygen Saturation % 90 %     1 Minute Oxygen Saturation % 88 %     1 Minute Liters of Oxygen 0 L     2 Minute Oxygen Saturation % 91 %  1:26 86%     2 Minute Liters of Oxygen 0 L  increased 2L     3 Minute Oxygen Saturation % 90 %     3 Minute Liters of Oxygen 2 L     4 Minute Oxygen Saturation % 98 %     4 Minute Liters of Oxygen 2 L     5 Minute Oxygen Saturation % 89 %  5:32 88%     5 Minute Liters of Oxygen 2 L  increased to 3L     6 Minute Oxygen Saturation % 93 %     6 Minute Liters of Oxygen 3 L     2 Minute Post Oxygen Saturation % 95 %     2 Minute Post Liters of Oxygen 2 L        Oxygen Initial Assessment:  Oxygen Initial Assessment - 01/14/24 1039       Home Oxygen   Home Oxygen Device Portable Concentrator;E-Tanks    Sleep Oxygen Prescription CPAP    Home Exercise Oxygen Prescription Continuous    Liters per minute 2   PRN  Home Resting Oxygen Prescription Continuous    Liters per minute 2   PRN   Compliance with Home Oxygen Use Yes      Initial 6 min Walk   Oxygen Used Continuous    Liters per minute 2      Program Oxygen Prescription   Program Oxygen  Prescription Continuous    Liters per minute 2      Intervention   Short Term Goals To learn and exhibit compliance with exercise, home and travel O2 prescription;To learn and understand importance of maintaining oxygen saturations>88%;To learn and demonstrate proper use of respiratory medications;To learn and understand importance of monitoring SPO2 with pulse oximeter and demonstrate accurate use of the pulse oximeter.;To learn and demonstrate proper pursed lip breathing techniques or other breathing techniques.     Long  Term Goals Exhibits compliance with exercise, home  and travel O2 prescription;Maintenance of O2 saturations>88%;Compliance with respiratory medication;Verbalizes importance of monitoring SPO2 with pulse oximeter and return demonstration;Exhibits proper breathing techniques, such as pursed lip breathing or other method taught during program session          Oxygen Re-Evaluation:  Oxygen Re-Evaluation     Row Name 01/16/24 1056 02/09/24 0959           Program Oxygen Prescription   Program Oxygen Prescription Continuous Continuous      Liters per minute 2 2        Home Oxygen   Home Oxygen Device Portable Concentrator;E-Tanks Portable Concentrator;E-Tanks      Sleep Oxygen Prescription CPAP CPAP      Home Exercise Oxygen Prescription Continuous Continuous      Liters per minute 2  PRN 2  PRN      Home Resting Oxygen Prescription Continuous Continuous      Liters per minute 2  PRN 2  PRN      Compliance with Home Oxygen Use Yes Yes        Goals/Expected Outcomes   Short Term Goals To learn and exhibit compliance with exercise, home and travel O2 prescription;To learn and understand importance of maintaining oxygen saturations>88%;To learn and demonstrate proper use of respiratory medications;To learn and understand importance of monitoring SPO2 with pulse oximeter and demonstrate accurate use of the pulse oximeter.;To learn and demonstrate proper pursed lip breathing  techniques or other breathing techniques.  To learn and exhibit compliance with exercise, home and travel O2 prescription;To learn and understand importance of maintaining oxygen saturations>88%;To learn and demonstrate proper use of respiratory medications;To learn and understand importance of monitoring SPO2 with pulse oximeter and demonstrate accurate use of the pulse oximeter.;To learn and demonstrate proper pursed lip breathing techniques or other breathing techniques.       Long  Term Goals Exhibits compliance with exercise, home  and travel O2 prescription;Maintenance of O2 saturations>88%;Compliance with respiratory medication;Verbalizes importance of monitoring SPO2 with pulse oximeter and return demonstration;Exhibits proper breathing techniques, such as pursed lip breathing or other method taught during program session Exhibits compliance with exercise, home  and travel O2 prescription;Maintenance of O2 saturations>88%;Compliance with respiratory medication;Verbalizes importance of monitoring SPO2 with pulse oximeter and return demonstration;Exhibits proper breathing techniques, such as pursed lip breathing or other method taught during program session      Goals/Expected Outcomes Compliance and understanding of oxygen saturation monitoring and breathing techniques to decrease shortness of breath. Compliance and understanding of oxygen saturation monitoring and breathing techniques to decrease shortness of breath.         Oxygen Discharge (Final  Oxygen Re-Evaluation):  Oxygen Re-Evaluation - 02/09/24 0959       Program Oxygen Prescription   Program Oxygen Prescription Continuous    Liters per minute 2      Home Oxygen   Home Oxygen Device Portable Concentrator;E-Tanks    Sleep Oxygen Prescription CPAP    Home Exercise Oxygen Prescription Continuous    Liters per minute 2   PRN   Home Resting Oxygen Prescription Continuous    Liters per minute 2   PRN   Compliance with Home Oxygen Use  Yes      Goals/Expected Outcomes   Short Term Goals To learn and exhibit compliance with exercise, home and travel O2 prescription;To learn and understand importance of maintaining oxygen saturations>88%;To learn and demonstrate proper use of respiratory medications;To learn and understand importance of monitoring SPO2 with pulse oximeter and demonstrate accurate use of the pulse oximeter.;To learn and demonstrate proper pursed lip breathing techniques or other breathing techniques.     Long  Term Goals Exhibits compliance with exercise, home  and travel O2 prescription;Maintenance of O2 saturations>88%;Compliance with respiratory medication;Verbalizes importance of monitoring SPO2 with pulse oximeter and return demonstration;Exhibits proper breathing techniques, such as pursed lip breathing or other method taught during program session    Goals/Expected Outcomes Compliance and understanding of oxygen saturation monitoring and breathing techniques to decrease shortness of breath.          Initial Exercise Prescription:  Initial Exercise Prescription - 01/14/24 1200       Date of Initial Exercise RX and Referring Provider   Date 01/14/24    Referring Provider Mannam    Expected Discharge Date 04/16/23      Oxygen   Oxygen Continuous    Liters 3    Maintain Oxygen Saturation 88% or higher      NuStep   Level 1    SPM 60    Minutes 15    METs 1.5      Track   Minutes 15    METs 2      Prescription Details   Frequency (times per week) 2    Duration Progress to 30 minutes of continuous aerobic without signs/symptoms of physical distress      Intensity   THRR 40-80% of Max Heartrate 60-121    Ratings of Perceived Exertion 11-13    Perceived Dyspnea 0-4      Progression   Progression Continue to progress workloads to maintain intensity without signs/symptoms of physical distress.      Resistance Training   Training Prescription Yes    Weight blue bands    Reps 10-15           Perform Capillary Blood Glucose checks as needed.  Exercise Prescription Changes:   Exercise Prescription Changes     Row Name 01/20/24 1100 02/03/24 1100 02/12/24 1150         Response to Exercise   Blood Pressure (Admit) 116/66 120/66 118/68     Blood Pressure (Exercise) 130/70 140/70 --     Blood Pressure (Exit) 122/64 104/60 120/68     Heart Rate (Admit) 78 bpm 101 bpm 83 bpm     Heart Rate (Exercise) 100 bpm 123 bpm 90 bpm     Heart Rate (Exit) 94 bpm 104 bpm 71 bpm     Oxygen Saturation (Admit) 92 %  RA 90 %  RA 95 %     Oxygen Saturation (Exercise) 90 %  2L 89 %  2L 91 %  Oxygen Saturation (Exit) 94 %  2L 90 %  2L 93 %     Rating of Perceived Exertion (Exercise) 12 13 11      Perceived Dyspnea (Exercise) 1 2 0     Duration Continue with 30 min of aerobic exercise without signs/symptoms of physical distress. Continue with 30 min of aerobic exercise without signs/symptoms of physical distress. Continue with 30 min of aerobic exercise without signs/symptoms of physical distress.     Intensity THRR unchanged THRR unchanged THRR unchanged       Progression   Progression Continue to progress workloads to maintain intensity without signs/symptoms of physical distress. Continue to progress workloads to maintain intensity without signs/symptoms of physical distress. Continue to progress workloads to maintain intensity without signs/symptoms of physical distress.       Resistance Training   Weight blue bands blue bands blue bands     Reps 10-15 10-15 10-15     Time 10 Minutes 10 Minutes 10 Minutes       Oxygen   Oxygen Continuous Continuous Intermittent     Liters 2 2 2        NuStep   Level 2 3 4      SPM -- 74 --     Minutes 15 15 30      METs 1.7 2.3 1.9       Track   Laps 16 18 --     Minutes 15 15 --     METs 2.72 3.25 --       Oxygen   Maintain Oxygen Saturation 88% or higher 88% or higher 88% or higher        Exercise Comments:   Exercise Goals and  Review:   Exercise Goals     Row Name 01/14/24 1055             Exercise Goals   Increase Physical Activity Yes       Intervention Provide advice, education, support and counseling about physical activity/exercise needs.;Develop an individualized exercise prescription for aerobic and resistive training based on initial evaluation findings, risk stratification, comorbidities and participant's personal goals.       Expected Outcomes Short Term: Attend rehab on a regular basis to increase amount of physical activity.;Long Term: Add in home exercise to make exercise part of routine and to increase amount of physical activity.;Long Term: Exercising regularly at least 3-5 days a week.       Increase Strength and Stamina Yes       Intervention Provide advice, education, support and counseling about physical activity/exercise needs.;Develop an individualized exercise prescription for aerobic and resistive training based on initial evaluation findings, risk stratification, comorbidities and participant's personal goals.       Expected Outcomes Short Term: Increase workloads from initial exercise prescription for resistance, speed, and METs.;Short Term: Perform resistance training exercises routinely during rehab and add in resistance training at home;Long Term: Improve cardiorespiratory fitness, muscular endurance and strength as measured by increased METs and functional capacity ( )       Able to understand and use rate of perceived exertion (RPE) scale Yes       Intervention Provide education and explanation on how to use RPE scale       Expected Outcomes Short Term: Able to use RPE daily in rehab to express subjective intensity level;Long Term:  Able to use RPE to guide intensity level when exercising independently       Able to understand and use Dyspnea scale Yes  Intervention Provide education and explanation on how to use Dyspnea scale       Expected Outcomes Short Term: Able to use  Dyspnea scale daily in rehab to express subjective sense of shortness of breath during exertion;Long Term: Able to use Dyspnea scale to guide intensity level when exercising independently       Knowledge and understanding of Target Heart Rate Range (THRR) Yes       Intervention Provide education and explanation of THRR including how the numbers were predicted and where they are located for reference       Expected Outcomes Short Term: Able to state/look up THRR;Long Term: Able to use THRR to govern intensity when exercising independently;Short Term: Able to use daily as guideline for intensity in rehab       Understanding of Exercise Prescription Yes       Intervention Provide education, explanation, and written materials on patient's individual exercise prescription       Expected Outcomes Short Term: Able to explain program exercise prescription;Long Term: Able to explain home exercise prescription to exercise independently          Exercise Goals Re-Evaluation :  Exercise Goals Re-Evaluation     Row Name 01/16/24 1055 02/09/24 0956           Exercise Goal Re-Evaluation   Exercise Goals Review Increase Physical Activity;Able to understand and use Dyspnea scale;Understanding of Exercise Prescription;Increase Strength and Stamina;Knowledge and understanding of Target Heart Rate Range (THRR);Able to understand and use rate of perceived exertion (RPE) scale Increase Physical Activity;Able to understand and use Dyspnea scale;Understanding of Exercise Prescription;Increase Strength and Stamina;Knowledge and understanding of Target Heart Rate Range (THRR);Able to understand and use rate of perceived exertion (RPE) scale      Comments [name] is scheduled to begin exercise on 12/23. [name] has completed 4 exercise sessions. He exercises for 15 min onthe Nustep and track. He averages 2.6 METs at level 4 on the Nustep and 2.61 METs on the track. He performs the warmup and cooldown standing without limitations.  He has increased his level on the Nustep several times. His track laps have also increased. Will consider treadmill walking. Will continue to monitor and progress as able.      Expected Outcomes Through exercise at rehab and home, the patient will decrease shortness of breath with daily activities and feel confident in carrying out an exercise regimen at home. Through exercise at rehab and home, the patient will decrease shortness of breath with daily activities and feel confident in carrying out an exercise regimen at home.         Discharge Exercise Prescription (Final Exercise Prescription Changes):  Exercise Prescription Changes - 02/12/24 1150       Response to Exercise   Blood Pressure (Admit) 118/68    Blood Pressure (Exit) 120/68    Heart Rate (Admit) 83 bpm    Heart Rate (Exercise) 90 bpm    Heart Rate (Exit) 71 bpm    Oxygen Saturation (Admit) 95 %    Oxygen Saturation (Exercise) 91 %    Oxygen Saturation (Exit) 93 %    Rating of Perceived Exertion (Exercise) 11    Perceived Dyspnea (Exercise) 0    Duration Continue with 30 min of aerobic exercise without signs/symptoms of physical distress.    Intensity THRR unchanged      Progression   Progression Continue to progress workloads to maintain intensity without signs/symptoms of physical distress.      Resistance Training  Weight blue bands    Reps 10-15    Time 10 Minutes      Oxygen   Oxygen Intermittent    Liters 2      NuStep   Level 4    Minutes 30    METs 1.9      Oxygen   Maintain Oxygen Saturation 88% or higher          Nutrition:  Target Goals: Understanding of nutrition guidelines, daily intake of sodium 1500mg , cholesterol 200mg , calories 30% from fat and 7% or less from saturated fats, daily to have 5 or more servings of fruits and vegetables.  Biometrics:    Nutrition Therapy Plan and Nutrition Goals:  Nutrition Therapy & Goals - 01/20/24 1257       Nutrition Therapy   Diet Regular       Personal Nutrition Goals   Nutrition Goal Patient to improve diet quality by using the plate method as a guide for meal planning to include lean protein/plant protein, fruits, vegetables, whole grains, nonfat dairy as part of a well-balanced diet.    Comments Patient with history of severe COPD. Current BMI 29.7 kg/m2 (overweight category). Pt with 6.7% wt gain over past 4.5 months; wt change may be partly skewed by fluid shift. Pt describes diet as consisting mostly of junk. Typically consumes sausage with white bread or sugary cereal for breakfast, hotdogs/fried chicken for lunch and hamburger for dinner. Snacks usually consist of easy-to-carry foods such as chips. RD provided suggestions for incorporating more plant-based, higher fiber foods into diet. Pt planning to switch out chips for nuts. Patient will benefit from participation in pulmonary rehab for nutrition education, exercise, and lifestyle modification.      Intervention Plan   Intervention Prescribe, educate and counsel regarding individualized specific dietary modifications aiming towards targeted core components such as weight, hypertension, lipid management, diabetes, heart failure and other comorbidities.;Nutrition handout(s) given to patient.   Handout: Nutrition Tips for Better Breathing   Expected Outcomes Short Term Goal: Understand basic principles of dietary content, such as calories, fat, sodium, cholesterol and nutrients.;Long Term Goal: Adherence to prescribed nutrition plan.          Nutrition Assessments:  MEDIFICTS Score Key: >=70 Need to make dietary changes  40-70 Heart Healthy Diet <= 40 Therapeutic Level Cholesterol Diet  Flowsheet Row PULMONARY REHAB CHRONIC OBSTRUCTIVE PULMONARY DISEASE from 01/20/2024 in [facility]  Picture Your Plate Total Score on Admission 26   Picture Your Plate Scores: <59 Unhealthy dietary pattern with much room for  improvement. 41-50 Dietary pattern unlikely to meet recommendations for good health and room for improvement. 51-60 More healthful dietary pattern, with some room for improvement.  >60 Healthy dietary pattern, although there may be some specific behaviors that could be improved.    Nutrition Goals Re-Evaluation:   Nutrition Goals Discharge (Final Nutrition Goals Re-Evaluation):   Psychosocial: Target Goals: Acknowledge presence or absence of significant depression and/or stress, maximize coping skills, provide positive support system. Participant is able to verbalize types and ability to use techniques and skills needed for reducing stress and depression.  Initial Review & Psychosocial Screening:  Initial Psych Review & Screening - 01/14/24 1035       Initial Review   Current issues with None Identified      Family Dynamics   Good Support System? Yes    Comments Spouse and son      Barriers  Psychosocial barriers to participate in program There are no identifiable barriers or psychosocial needs.      Screening Interventions   Interventions Encouraged to exercise          Quality of Life Scores:  Scores of 19 and below usually indicate a poorer quality of life in these areas.  A difference of  2-3 points is a clinically meaningful difference.  A difference of 2-3 points in the total score of the Quality of Life Index has been associated with significant improvement in overall quality of life, self-image, physical symptoms, and general health in studies assessing change in quality of life.  PHQ-9: Review Flowsheet       01/14/2024  Depression screen PHQ 2/9  Decreased Interest 1  Down, Depressed, Hopeless 0  PHQ - 2 Score 1  Altered sleeping 0  Tired, decreased energy 1  Change in appetite 0  Feeling bad or failure about yourself  1  Trouble concentrating 0  Moving slowly or fidgety/restless 0  Suicidal thoughts 0  PHQ-9 Score 3  Difficult doing work/chores Not  difficult at all   Interpretation of Total Score  Total Score Depression Severity:  1-4 = Minimal depression, 5-9 = Mild depression, 10-14 = Moderate depression, 15-19 = Moderately severe depression, 20-27 = Severe depression   Psychosocial Evaluation and Intervention:  Psychosocial Evaluation - 01/14/24 1036       Psychosocial Evaluation & Interventions   Interventions Encouraged to exercise with the program and follow exercise prescription    Comments [name] denies any psychosocial barriers at this time.    Expected Outcomes For [name] to participate in PR free of concerns.    Continue Psychosocial Services  No Follow up required          Psychosocial Re-Evaluation:  Psychosocial Re-Evaluation     Row Name 01/16/24 0934 02/11/24 9057           Psychosocial Re-Evaluation   Current issues with None Identified None Identified      Comments [name] is scheduled to start PR on 01/20/24. No new barriers or concerns at this time. 30 day psy/soc re-eval as follows: [name] continues to deny any new psy/soc barriers or concerns at this time. He continues to have good support from his family. He denies any needs at this time.      Expected Outcomes For [name] to participate in PR free of any concerns For [name] to participate in PR free of any concerns      Interventions Encouraged to attend Pulmonary Rehabilitation for the exercise Encouraged to attend Pulmonary Rehabilitation for the exercise      Continue Psychosocial Services  No Follow up required No Follow up required         Psychosocial Discharge (Final Psychosocial Re-Evaluation):  Psychosocial Re-Evaluation - 02/11/24 0942       Psychosocial Re-Evaluation   Current issues with None Identified    Comments 30 day psy/soc re-eval as follows: [name] continues to deny any new psy/soc barriers or concerns at this time. He continues to have good support from his family. He denies any needs at this time.    Expected Outcomes For [name] to participate in  PR free of any concerns    Interventions Encouraged to attend Pulmonary Rehabilitation for the exercise    Continue Psychosocial Services  No Follow up required          Education: Education Goals: Education classes will be provided on a weekly basis, covering required topics. Participant  will state understanding/return demonstration of topics presented.  Learning Barriers/Preferences:  Learning Barriers/Preferences - 01/14/24 1037       Learning Barriers/Preferences   Learning Barriers Sight    Learning Preferences None          Education Topics: Know Your Numbers Group instruction that is supported by a PowerPoint presentation. Instructor discusses importance of knowing and understanding resting, exercise, and post-exercise oxygen saturation, heart rate, and blood pressure. Oxygen saturation, heart rate, blood pressure, rating of perceived exertion, and dyspnea are reviewed along with a normal range for these values.    Exercise for the Pulmonary Patient Group instruction that is supported by a PowerPoint presentation. Instructor discusses benefits of exercise, core components of exercise, frequency, duration, and intensity of an exercise routine, importance of utilizing pulse oximetry during exercise, safety while exercising, and options of places to exercise outside of rehab.    MET Level  Group instruction provided by PowerPoint, verbal discussion, and written material to support subject matter. Instructor reviews what METs are and how to increase METs.    Pulmonary Medications Verbally interactive group education provided by instructor with focus on inhaled medications and proper administration.   Anatomy and Physiology of the Respiratory System Group instruction provided by PowerPoint, verbal discussion, and written material to support subject matter. Instructor reviews respiratory cycle and anatomical components of the respiratory system and their functions.  Instructor also reviews differences in obstructive and restrictive respiratory diseases with examples of each.    Oxygen Safety Group instruction provided by PowerPoint, verbal discussion, and written material to support subject matter. There is an overview of What is Oxygen and Why do we need it.  Instructor also reviews how to create a safe environment for oxygen use, the importance of using oxygen as prescribed, and the risks of noncompliance. There is a brief discussion on traveling with oxygen and resources the patient may utilize. Flowsheet Row PULMONARY REHAB CHRONIC OBSTRUCTIVE PULMONARY DISEASE from 02/05/2024 in [facility]  Date 02/05/24  Educator RN  Instruction Review Code 1- Verbalizes Understanding    Oxygen Use Group instruction provided by PowerPoint, verbal discussion, and written material to discuss how supplemental oxygen is prescribed and different types of oxygen supply systems. Resources for more information are provided.    Breathing Techniques Group instruction that is supported by demonstration and informational handouts. Instructor discusses the benefits of pursed lip and diaphragmatic breathing and detailed demonstration on how to perform both.     Risk Factor Reduction Group instruction that is supported by a PowerPoint presentation. Instructor discusses the definition of a risk factor, different risk factors for pulmonary disease, and how the heart and lungs work together.   Pulmonary Diseases Group instruction provided by PowerPoint, verbal discussion, and written material to support subject matter. Instructor gives an overview of the different type of pulmonary diseases. There is also a discussion on risk factors and symptoms as well as ways to manage the diseases.   Stress and Energy Conservation Group instruction provided by PowerPoint, verbal discussion, and written material to support subject  matter. Instructor gives an overview of stress and the impact it can have on the body. Instructor also reviews ways to reduce stress. There is also a discussion on energy conservation and ways to conserve energy throughout the day.   Warning Signs and Symptoms Group instruction provided by PowerPoint, verbal discussion, and written material to support subject matter. Instructor reviews warning signs and symptoms of  stroke, heart attack, cold and flu. Instructor also reviews ways to prevent the spread of infection.   Other Education Group or individual verbal, written, or video instructions that support the educational goals of the pulmonary rehab program.    Knowledge Questionnaire Score:  Knowledge Questionnaire Score - 01/14/24 1200       Knowledge Questionnaire Score   Pre Score 16/18          Core Components/Risk Factors/Patient Goals at Admission:  Personal Goals and Risk Factors at Admission - 01/14/24 1037       Core Components/Risk Factors/Patient Goals on Admission   Improve shortness of breath with ADL's Yes    Intervention Provide education, individualized exercise plan and daily activity instruction to help decrease symptoms of SOB with activities of daily living.    Expected Outcomes Short Term: Improve cardiorespiratory fitness to achieve a reduction of symptoms when performing ADLs;Long Term: Be able to perform more ADLs without symptoms or delay the onset of symptoms    Increase knowledge of respiratory medications and ability to use respiratory devices properly  Yes    Intervention Provide education and demonstration as needed of appropriate use of medications, inhalers, and oxygen therapy.    Expected Outcomes Short Term: Achieves understanding of medications use. Understands that oxygen is a medication prescribed by physician. Demonstrates appropriate use of inhaler and oxygen therapy.;Long Term: Maintain appropriate use of medications, inhalers, and oxygen therapy.           Core Components/Risk Factors/Patient Goals Review:   Goals and Risk Factor Review     Row Name 01/16/24 0935 02/11/24 0943           Core Components/Risk Factors/Patient Goals Review   Personal Goals Review Improve shortness of breath with ADL's;Develop more efficient breathing techniques such as purse lipped breathing and diaphragmatic breathing and practicing self-pacing with activity.;Increase knowledge of respiratory medications and ability to use respiratory devices properly. Improve shortness of breath with ADL's;Develop more efficient breathing techniques such as purse lipped breathing and diaphragmatic breathing and practicing self-pacing with activity.;Increase knowledge of respiratory medications and ability to use respiratory devices properly.      Review [name] has not started PR class yet. Unable to asses his goals at this time. Monthly review of patients Core Components/Risk Factors/Patient Goals are as follows: Goal progressing for improving shortness of breath. [name] is currently exercising on 2L to keep sats >88%. Goal progressing for developing more efficient breathing techniques such as purse lipped breathing and diaphragmatic breathing; and practicing self-pacing with activity. Goal progressing for increase knowledge of respiratory medications and ability to use respiratory devices properly. We will continue to monitor Jim's progress throughout the program.      Expected Outcomes To improve shortness of breath with ADL's, increase knowledge of respiratory medications and ability to use respiratory devices properly and develop more efficient breathing techniques such as purse lipped breathing and diaphragmatic breathing; and practicing self-pacing with activity. To improve shortness of breath with ADL's, increase knowledge of respiratory medications and ability to use respiratory devices properly and develop more efficient breathing techniques such as purse lipped breathing  and diaphragmatic breathing; and practicing self-pacing with activity.         Core Components/Risk Factors/Patient Goals at Discharge (Final Review):   Goals and Risk Factor Review - 02/11/24 0943       Core Components/Risk Factors/Patient Goals Review   Personal Goals Review Improve shortness of breath with ADL's;Develop more efficient breathing techniques such as purse lipped  breathing and diaphragmatic breathing and practicing self-pacing with activity.;Increase knowledge of respiratory medications and ability to use respiratory devices properly.    Review Monthly review of patients Core Components/Risk Factors/Patient Goals are as follows: Goal progressing for improving shortness of breath. [name] is currently exercising on 2L to keep sats >88%. Goal progressing for developing more efficient breathing techniques such as purse lipped breathing and diaphragmatic breathing; and practicing self-pacing with activity. Goal progressing for increase knowledge of respiratory medications and ability to use respiratory devices properly. We will continue to monitor Jim's progress throughout the program.    Expected Outcomes To improve shortness of breath with ADL's, increase knowledge of respiratory medications and ability to use respiratory devices properly and develop more efficient breathing techniques such as purse lipped breathing and diaphragmatic breathing; and practicing self-pacing with activity.          ITP Comments:Pt is making expected progress toward Pulmonary Rehab goals after completing 5 session(s). Recommend continued exercise, life style modification, education, and utilization of breathing techniques to increase stamina and strength, while also decreasing shortness of breath with exertion.  Dr. Slater Staff is Medical Director for Pulmonary Rehab at [facility].          [1]  Current Outpatient Medications:    albuterol  (VENTOLIN  HFA) 108 (90 Base) MCG/ACT inhaler,  Inhale 2 puffs into the lungs every 6 (six) hours as needed for wheezing or shortness of breath (Use when out of your home or if unable to reach your nebulizer machine.)., Disp: , Rfl:    alendronate (FOSAMAX) 70 MG tablet, Take 70 mg by mouth every Monday. (Patient taking differently: Take 70 mg by mouth every Monday. PRN), Disp: , Rfl:    azithromycin  (ZITHROMAX ) 250 MG tablet, Take 1 tablet (250 mg total) by mouth 3 (three) times a week., Disp: 36 tablet, Rfl: 3   budesonide -glycopyrrolate -formoterol  (BREZTRI  AEROSPHERE) 160-9-4.8 MCG/ACT AERO inhaler, Inhale 2 puffs into the lungs every 12 (twelve) hours., Disp: 32.1 g, Rfl: 0   Cholecalciferol (VITAMIN D3 MAXIMUM STRENGTH) 125 MCG (5000 UT) capsule, Take 5,000 Units by mouth daily., Disp: , Rfl:    ferrous sulfate 325 (65 FE) MG EC tablet, Take 325 mg by mouth daily., Disp: , Rfl:    furosemide (LASIX) 80 MG tablet, Take 160 mg by mouth 2 (two) times daily., Disp: , Rfl:    HYDROcodone -acetaminophen  (NORCO) 7.5-325 MG tablet, Take 1 tablet by mouth every 4 (four) hours as needed for severe pain (pain score 7-10)., Disp: , Rfl:    ibuprofen (ADVIL) 200 MG tablet, Take 400 mg by mouth every 6 (six) hours as needed for moderate pain (pain score 4-6)., Disp: , Rfl:    ipratropium (ATROVENT ) 0.06 % nasal spray, Can use two sprays in each nostril every six hours if needed to dry up nose., Disp: 15 mL, Rfl: 5   ipratropium-albuterol  (DUONEB) 0.5-2.5 (3) MG/3ML SOLN, Take 3 mLs by nebulization every 4 (four) hours as needed (shortness of breath or wheezing. Use when at home.)., Disp: 360 mL, Rfl: 0   Multiple Vitamin (MULTIVITAMIN ADULT PO), Take by mouth daily., Disp: , Rfl:    OHTUVAYRE  3 MG/2.5ML SUSP, Inhale 2.5 mLs into the lungs 2 (two) times daily., Disp: , Rfl:    omeprazole  (PRILOSEC) 40 MG capsule, Take 1 capsule (40 mg total) by mouth daily. Call for appointment for future refills, Disp: 90 capsule, Rfl: 0   oxyCODONE -acetaminophen  (PERCOCET)  5-325 MG tablet, Take 1 tablet by mouth every 4 (  four) hours as needed for severe pain (pain score 7-10)., Disp: 20 tablet, Rfl: 0   predniSONE  (DELTASONE ) 50 MG tablet, One tablet a day for 5 days, Disp: 5 tablet, Rfl: 0   sildenafil (VIAGRA) 100 MG tablet, Take 100 mg by mouth daily as needed., Disp: , Rfl:    traZODone  (DESYREL ) 150 MG tablet, Take 0.5-1 tablets (75-150 mg total) by mouth at bedtime for sleep or mood., Disp: 90 tablet, Rfl: 3 [2]  Social History Tobacco Use  Smoking Status Former   Current packs/day: 0.00   Average packs/day: 0.3 packs/day for 52.6 years (13.1 ttl pk-yrs)   Types: Cigarettes   Start date: 09/1970   Quit date: 04/2023   Years since quitting: 0.8  Smokeless Tobacco Never  Tobacco Comments   Quit smoking in April 2025.

## 2024-02-19 ENCOUNTER — Telehealth (HOSPITAL_COMMUNITY): Payer: Self-pay

## 2024-02-19 ENCOUNTER — Encounter (HOSPITAL_COMMUNITY): Admission: RE | Admit: 2024-02-19 | Payer: Self-pay | Source: Ambulatory Visit

## 2024-02-19 NOTE — Telephone Encounter (Signed)
 Received call from pt. 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 Jim asked to cancel his appt for today. I told [name] if his back pain continues, he may need to discharge from the program. [name] does not want to be discharged right now. I told [name] we could wait till next week. He agreed and voiced understanding. Pt has department number.

## 2024-02-24 ENCOUNTER — Encounter (HOSPITAL_COMMUNITY): Payer: Self-pay

## 2024-02-26 ENCOUNTER — Encounter (HOSPITAL_COMMUNITY): Admission: RE | Admit: 2024-02-26 | Payer: Self-pay

## 2024-02-26 ENCOUNTER — Telehealth (HOSPITAL_COMMUNITY): Payer: Self-pay | Admitting: *Deleted

## 2024-02-26 NOTE — Telephone Encounter (Signed)
 Called pt to notify him of Pulmonary Rehab elevator being out of service today. He sts he is in severe back pain. He is awaiting meds and an MD appt. Will cancel today. He will call us  next week to discuss his ability to continue the program.  Aliene Aris BS, ACSM-CEP 02/26/2024 9:02 AM

## 2024-03-02 ENCOUNTER — Encounter (HOSPITAL_COMMUNITY): Payer: Self-pay

## 2024-03-02 ENCOUNTER — Telehealth (HOSPITAL_COMMUNITY): Payer: Self-pay

## 2024-03-04 ENCOUNTER — Encounter (HOSPITAL_COMMUNITY): Payer: Self-pay

## 2024-03-05 ENCOUNTER — Telehealth (HOSPITAL_COMMUNITY): Payer: Self-pay

## 2024-03-05 NOTE — Telephone Encounter (Signed)
 Returned pt's call this AM. Pt stated he had a MRI of his back and will probably need surgery. Needs to be discharged from the [facility]. Informed [name] when he is feeling better he can get a new referral from Dr. Theophilus and he would be able to participate again.

## 2024-03-09 ENCOUNTER — Encounter (HOSPITAL_COMMUNITY): Payer: Self-pay

## 2024-03-11 ENCOUNTER — Encounter (HOSPITAL_COMMUNITY): Payer: Self-pay

## 2024-03-16 ENCOUNTER — Encounter (HOSPITAL_COMMUNITY): Payer: Self-pay

## 2024-03-18 ENCOUNTER — Encounter (HOSPITAL_COMMUNITY): Payer: Self-pay

## 2024-03-23 ENCOUNTER — Encounter (HOSPITAL_COMMUNITY): Payer: Self-pay

## 2024-03-25 ENCOUNTER — Encounter (HOSPITAL_COMMUNITY): Payer: Self-pay

## 2024-03-30 ENCOUNTER — Encounter (HOSPITAL_COMMUNITY): Payer: Self-pay

## 2024-04-01 ENCOUNTER — Encounter (HOSPITAL_COMMUNITY): Payer: Self-pay

## 2024-04-06 ENCOUNTER — Encounter (HOSPITAL_COMMUNITY): Payer: Self-pay

## 2024-04-08 ENCOUNTER — Encounter (HOSPITAL_COMMUNITY): Payer: Self-pay

## 2024-04-13 ENCOUNTER — Encounter (HOSPITAL_COMMUNITY): Payer: Self-pay

## 2024-04-15 ENCOUNTER — Encounter (HOSPITAL_COMMUNITY): Payer: Self-pay

## 2024-07-12 ENCOUNTER — Ambulatory Visit: Admitting: Pulmonary Disease
